# Patient Record
Sex: Female | Born: 1945 | Race: Black or African American | Hispanic: No | Smoking: Never smoker
Health system: Southern US, Community
[De-identification: ages and names within clinical notes are randomized; demographics above are authoritative.]

## PROBLEM LIST (undated history)

## (undated) DIAGNOSIS — K219 Gastro-esophageal reflux disease without esophagitis: Secondary | ICD-10-CM

## (undated) DIAGNOSIS — Z5189 Encounter for other specified aftercare: Secondary | ICD-10-CM

## (undated) DIAGNOSIS — E785 Hyperlipidemia, unspecified: Secondary | ICD-10-CM

## (undated) DIAGNOSIS — E119 Type 2 diabetes mellitus without complications: Secondary | ICD-10-CM

## (undated) DIAGNOSIS — D649 Anemia, unspecified: Secondary | ICD-10-CM

## (undated) DIAGNOSIS — F419 Anxiety disorder, unspecified: Secondary | ICD-10-CM

## (undated) DIAGNOSIS — T7840XA Allergy, unspecified, initial encounter: Secondary | ICD-10-CM

## (undated) DIAGNOSIS — I10 Essential (primary) hypertension: Secondary | ICD-10-CM

## (undated) DIAGNOSIS — H269 Unspecified cataract: Secondary | ICD-10-CM

## (undated) DIAGNOSIS — M1990 Unspecified osteoarthritis, unspecified site: Secondary | ICD-10-CM

## (undated) HISTORY — DX: Gastro-esophageal reflux disease without esophagitis: K21.9

## (undated) HISTORY — DX: Allergy, unspecified, initial encounter: T78.40XA

## (undated) HISTORY — DX: Unspecified osteoarthritis, unspecified site: M19.90

## (undated) HISTORY — DX: Unspecified cataract: H26.9

## (undated) HISTORY — DX: Encounter for other specified aftercare: Z51.89

## (undated) HISTORY — DX: Hyperlipidemia, unspecified: E78.5

## (undated) HISTORY — PX: SPINE SURGERY: SHX786

## (undated) HISTORY — PX: POLYPECTOMY: SHX149

## (undated) HISTORY — PX: KNEE SURGERY: SHX244

## (undated) HISTORY — PX: COLONOSCOPY: SHX174

## (undated) HISTORY — DX: Type 2 diabetes mellitus without complications: E11.9

## (undated) HISTORY — DX: Essential (primary) hypertension: I10

## (undated) HISTORY — DX: Anemia, unspecified: D64.9

## (undated) HISTORY — PX: CATARACT EXTRACTION, BILATERAL: SHX1313

## (undated) HISTORY — DX: Anxiety disorder, unspecified: F41.9

## (undated) HISTORY — PX: BUNIONECTOMY: SHX129

---

## 1999-06-17 ENCOUNTER — Encounter: Admission: RE | Admit: 1999-06-17 | Discharge: 1999-06-17 | Payer: Self-pay | Admitting: *Deleted

## 2000-07-31 ENCOUNTER — Emergency Department (HOSPITAL_COMMUNITY): Admission: EM | Admit: 2000-07-31 | Discharge: 2000-07-31 | Payer: Self-pay | Admitting: Emergency Medicine

## 2006-03-09 ENCOUNTER — Encounter: Payer: Self-pay | Admitting: Internal Medicine

## 2006-03-09 LAB — CONVERTED CEMR LAB

## 2006-08-03 ENCOUNTER — Ambulatory Visit: Payer: Self-pay | Admitting: Oncology

## 2006-10-10 ENCOUNTER — Ambulatory Visit: Payer: Self-pay | Admitting: Internal Medicine

## 2006-10-10 LAB — CONVERTED CEMR LAB
ALT: 11 units/L (ref 0–35)
AST: 15 units/L (ref 0–37)
Albumin: 4.1 g/dL (ref 3.5–5.2)
Alkaline Phosphatase: 63 units/L (ref 39–117)
BUN: 11 mg/dL (ref 6–23)
Basophils Absolute: 0 10*3/uL (ref 0.0–0.1)
Basophils Relative: 1 % (ref 0.0–1.0)
Bilirubin, Direct: 0.2 mg/dL (ref 0.0–0.3)
CO2: 28 meq/L (ref 19–32)
Calcium: 9.1 mg/dL (ref 8.4–10.5)
Chloride: 101 meq/L (ref 96–112)
Creatinine, Ser: 0.8 mg/dL (ref 0.4–1.2)
Creatinine,U: 164 mg/dL
Eosinophils Absolute: 0 10*3/uL (ref 0.0–0.6)
Eosinophils Relative: 0.7 % (ref 0.0–5.0)
GFR calc Af Amer: 95 mL/min
GFR calc non Af Amer: 78 mL/min
Glucose, Bld: 84 mg/dL (ref 70–99)
HCT: 37.7 % (ref 36.0–46.0)
Hemoglobin: 12.5 g/dL (ref 12.0–15.0)
Hgb A1c MFr Bld: 5.1 % (ref 4.6–6.0)
Lymphocytes Relative: 45 % (ref 12.0–46.0)
MCHC: 33.1 g/dL (ref 30.0–36.0)
MCV: 67.7 fL — ABNORMAL LOW (ref 78.0–100.0)
Microalb Creat Ratio: 2.4 mg/g (ref 0.0–30.0)
Microalb, Ur: 0.4 mg/dL (ref 0.0–1.9)
Monocytes Absolute: 0.2 10*3/uL (ref 0.2–0.7)
Monocytes Relative: 7.2 % (ref 3.0–11.0)
Neutro Abs: 1.5 10*3/uL (ref 1.4–7.7)
Neutrophils Relative %: 46.1 % (ref 43.0–77.0)
Platelets: 121 10*3/uL — ABNORMAL LOW (ref 150–400)
Potassium: 3.5 meq/L (ref 3.5–5.1)
RBC: 5.57 M/uL — ABNORMAL HIGH (ref 3.87–5.11)
RDW: 15.5 % — ABNORMAL HIGH (ref 11.5–14.6)
Sodium: 138 meq/L (ref 135–145)
TSH: 0.85 microintl units/mL (ref 0.35–5.50)
Total Bilirubin: 0.9 mg/dL (ref 0.3–1.2)
Total Protein: 6.7 g/dL (ref 6.0–8.3)
WBC: 3.3 10*3/uL — ABNORMAL LOW (ref 4.5–10.5)

## 2006-10-29 ENCOUNTER — Ambulatory Visit: Payer: Self-pay | Admitting: Family Medicine

## 2006-11-24 ENCOUNTER — Encounter: Payer: Self-pay | Admitting: Internal Medicine

## 2006-11-24 DIAGNOSIS — R519 Headache, unspecified: Secondary | ICD-10-CM | POA: Insufficient documentation

## 2006-11-24 DIAGNOSIS — E119 Type 2 diabetes mellitus without complications: Secondary | ICD-10-CM | POA: Insufficient documentation

## 2006-11-24 DIAGNOSIS — M1990 Unspecified osteoarthritis, unspecified site: Secondary | ICD-10-CM | POA: Insufficient documentation

## 2006-11-24 DIAGNOSIS — K219 Gastro-esophageal reflux disease without esophagitis: Secondary | ICD-10-CM | POA: Insufficient documentation

## 2006-11-24 DIAGNOSIS — F329 Major depressive disorder, single episode, unspecified: Secondary | ICD-10-CM

## 2006-11-24 DIAGNOSIS — R51 Headache: Secondary | ICD-10-CM | POA: Insufficient documentation

## 2006-11-24 DIAGNOSIS — Z860100 Personal history of colon polyps, unspecified: Secondary | ICD-10-CM | POA: Insufficient documentation

## 2006-11-24 DIAGNOSIS — F418 Other specified anxiety disorders: Secondary | ICD-10-CM | POA: Insufficient documentation

## 2006-11-24 DIAGNOSIS — I10 Essential (primary) hypertension: Secondary | ICD-10-CM

## 2006-11-29 ENCOUNTER — Ambulatory Visit: Payer: Self-pay | Admitting: Internal Medicine

## 2007-02-06 ENCOUNTER — Ambulatory Visit: Payer: Self-pay | Admitting: Internal Medicine

## 2007-02-06 DIAGNOSIS — M21619 Bunion of unspecified foot: Secondary | ICD-10-CM

## 2007-02-06 DIAGNOSIS — M76899 Other specified enthesopathies of unspecified lower limb, excluding foot: Secondary | ICD-10-CM

## 2007-02-06 DIAGNOSIS — G4700 Insomnia, unspecified: Secondary | ICD-10-CM

## 2007-02-06 DIAGNOSIS — D72819 Decreased white blood cell count, unspecified: Secondary | ICD-10-CM | POA: Insufficient documentation

## 2007-02-06 DIAGNOSIS — D696 Thrombocytopenia, unspecified: Secondary | ICD-10-CM

## 2007-02-06 DIAGNOSIS — F411 Generalized anxiety disorder: Secondary | ICD-10-CM | POA: Insufficient documentation

## 2007-04-18 ENCOUNTER — Ambulatory Visit: Payer: Self-pay | Admitting: Internal Medicine

## 2007-04-18 DIAGNOSIS — G5600 Carpal tunnel syndrome, unspecified upper limb: Secondary | ICD-10-CM | POA: Insufficient documentation

## 2007-04-18 DIAGNOSIS — N390 Urinary tract infection, site not specified: Secondary | ICD-10-CM

## 2007-04-18 DIAGNOSIS — M25519 Pain in unspecified shoulder: Secondary | ICD-10-CM

## 2007-04-19 LAB — CONVERTED CEMR LAB
BUN: 13 mg/dL (ref 6–23)
Bacteria, UA: NEGATIVE
Bilirubin Urine: NEGATIVE
CO2: 34 meq/L — ABNORMAL HIGH (ref 19–32)
Calcium: 9.7 mg/dL (ref 8.4–10.5)
Chloride: 100 meq/L (ref 96–112)
Cholesterol: 175 mg/dL (ref 0–200)
Creatinine, Ser: 0.9 mg/dL (ref 0.4–1.2)
Crystals: NEGATIVE
GFR calc Af Amer: 82 mL/min
GFR calc non Af Amer: 68 mL/min
Glucose, Bld: 194 mg/dL — ABNORMAL HIGH (ref 70–99)
HDL: 83.7 mg/dL (ref >39.0)
Hgb A1c MFr Bld: 7.2 % — ABNORMAL HIGH (ref 4.6–6.0)
LDL Cholesterol: 77 mg/dL (ref 0–99)
Mucus, UA: NEGATIVE
Nitrite: NEGATIVE
Potassium: 4.3 meq/L (ref 3.5–5.1)
Sodium: 141 meq/L (ref 135–145)
Specific Gravity, Urine: 1.025 (ref 1.000–1.03)
Total CHOL/HDL Ratio: 2.1
Total Protein, Urine: NEGATIVE mg/dL
Triglycerides: 71 mg/dL (ref 0–149)
Urine Glucose: 100 mg/dL — AB
Urobilinogen, UA: 0.2 (ref 0.0–1.0)
VLDL: 14 mg/dL (ref 0–40)
pH: 6 (ref 5.0–8.0)

## 2007-04-21 ENCOUNTER — Encounter: Payer: Self-pay | Admitting: Internal Medicine

## 2007-05-12 ENCOUNTER — Ambulatory Visit: Payer: Self-pay | Admitting: Internal Medicine

## 2007-05-15 ENCOUNTER — Encounter: Payer: Self-pay | Admitting: Internal Medicine

## 2007-05-17 ENCOUNTER — Encounter: Payer: Self-pay | Admitting: Internal Medicine

## 2007-05-25 ENCOUNTER — Encounter: Payer: Self-pay | Admitting: Internal Medicine

## 2007-06-21 ENCOUNTER — Encounter: Payer: Self-pay | Admitting: Internal Medicine

## 2007-08-07 ENCOUNTER — Ambulatory Visit: Payer: Self-pay | Admitting: Internal Medicine

## 2007-08-07 LAB — CONVERTED CEMR LAB
BUN: 15 mg/dL (ref 6–23)
Blood Glucose, Fingerstick: 128
CO2: 32 meq/L (ref 19–32)
Calcium: 9.3 mg/dL (ref 8.4–10.5)
Chloride: 99 meq/L (ref 96–112)
Cholesterol: 194 mg/dL (ref 0–200)
Creatinine, Ser: 0.9 mg/dL (ref 0.4–1.2)
GFR calc Af Amer: 82 mL/min
GFR calc non Af Amer: 68 mL/min
Glucose, Bld: 122 mg/dL — ABNORMAL HIGH (ref 70–99)
HDL: 96.6 mg/dL (ref >39.0)
Hgb A1c MFr Bld: 6.3 % — ABNORMAL HIGH (ref 4.6–6.0)
LDL Cholesterol: 88 mg/dL (ref 0–99)
Potassium: 4.1 meq/L (ref 3.5–5.1)
Sodium: 139 meq/L (ref 135–145)
Total CHOL/HDL Ratio: 2
Triglycerides: 45 mg/dL (ref 0–149)
VLDL: 9 mg/dL (ref 0–40)

## 2007-11-27 ENCOUNTER — Encounter: Payer: Self-pay | Admitting: Internal Medicine

## 2011-01-13 DIAGNOSIS — N952 Postmenopausal atrophic vaginitis: Secondary | ICD-10-CM | POA: Insufficient documentation

## 2011-01-13 DIAGNOSIS — E119 Type 2 diabetes mellitus without complications: Secondary | ICD-10-CM | POA: Insufficient documentation

## 2012-11-17 DIAGNOSIS — M1710 Unilateral primary osteoarthritis, unspecified knee: Secondary | ICD-10-CM | POA: Insufficient documentation

## 2012-11-17 DIAGNOSIS — M23309 Other meniscus derangements, unspecified meniscus, unspecified knee: Secondary | ICD-10-CM | POA: Insufficient documentation

## 2014-01-23 DIAGNOSIS — G8929 Other chronic pain: Secondary | ICD-10-CM | POA: Insufficient documentation

## 2014-01-23 DIAGNOSIS — M542 Cervicalgia: Secondary | ICD-10-CM | POA: Insufficient documentation

## 2014-03-05 DIAGNOSIS — M5432 Sciatica, left side: Secondary | ICD-10-CM | POA: Insufficient documentation

## 2014-03-05 DIAGNOSIS — M5431 Sciatica, right side: Secondary | ICD-10-CM | POA: Insufficient documentation

## 2014-03-05 DIAGNOSIS — M7062 Trochanteric bursitis, left hip: Secondary | ICD-10-CM | POA: Insufficient documentation

## 2014-03-05 DIAGNOSIS — M7061 Trochanteric bursitis, right hip: Secondary | ICD-10-CM | POA: Insufficient documentation

## 2014-06-13 DIAGNOSIS — M7541 Impingement syndrome of right shoulder: Secondary | ICD-10-CM | POA: Insufficient documentation

## 2014-06-20 DIAGNOSIS — M19019 Primary osteoarthritis, unspecified shoulder: Secondary | ICD-10-CM | POA: Insufficient documentation

## 2015-05-20 DIAGNOSIS — L299 Pruritus, unspecified: Secondary | ICD-10-CM | POA: Insufficient documentation

## 2015-05-20 DIAGNOSIS — J301 Allergic rhinitis due to pollen: Secondary | ICD-10-CM | POA: Insufficient documentation

## 2015-08-20 DIAGNOSIS — Z789 Other specified health status: Secondary | ICD-10-CM | POA: Insufficient documentation

## 2015-11-27 DIAGNOSIS — M5440 Lumbago with sciatica, unspecified side: Secondary | ICD-10-CM | POA: Insufficient documentation

## 2015-11-27 DIAGNOSIS — M4326 Fusion of spine, lumbar region: Secondary | ICD-10-CM | POA: Insufficient documentation

## 2015-11-27 DIAGNOSIS — G8929 Other chronic pain: Secondary | ICD-10-CM | POA: Insufficient documentation

## 2016-10-15 DIAGNOSIS — R0989 Other specified symptoms and signs involving the circulatory and respiratory systems: Secondary | ICD-10-CM | POA: Insufficient documentation

## 2017-08-08 DIAGNOSIS — E119 Type 2 diabetes mellitus without complications: Secondary | ICD-10-CM | POA: Insufficient documentation

## 2017-09-26 DIAGNOSIS — F3342 Major depressive disorder, recurrent, in full remission: Secondary | ICD-10-CM | POA: Insufficient documentation

## 2018-04-07 ENCOUNTER — Ambulatory Visit: Payer: Self-pay | Admitting: Nurse Practitioner

## 2018-07-03 ENCOUNTER — Ambulatory Visit: Payer: Self-pay | Admitting: Nurse Practitioner

## 2018-07-14 ENCOUNTER — Ambulatory Visit (INDEPENDENT_AMBULATORY_CARE_PROVIDER_SITE_OTHER): Payer: Medicare Other | Admitting: Internal Medicine

## 2018-07-14 ENCOUNTER — Other Ambulatory Visit: Payer: Self-pay

## 2018-07-14 ENCOUNTER — Encounter: Payer: Self-pay | Admitting: Internal Medicine

## 2018-07-14 VITALS — BP 142/80 | HR 76 | Temp 97.7°F | Ht 63.0 in | Wt 171.4 lb

## 2018-07-14 DIAGNOSIS — E1165 Type 2 diabetes mellitus with hyperglycemia: Secondary | ICD-10-CM | POA: Diagnosis not present

## 2018-07-14 LAB — BASIC METABOLIC PANEL
BUN: 17 mg/dL (ref 6–23)
CO2: 29 mEq/L (ref 19–32)
Calcium: 9.7 mg/dL (ref 8.4–10.5)
Chloride: 100 mEq/L (ref 96–112)
Creatinine, Ser: 0.87 mg/dL (ref 0.40–1.20)
GFR: 77.76 mL/min (ref >60.00)
Glucose, Bld: 235 mg/dL — ABNORMAL HIGH (ref 70–99)
Potassium: 4 mEq/L (ref 3.5–5.1)
Sodium: 138 mEq/L (ref 135–145)

## 2018-07-14 LAB — MICROALBUMIN / CREATININE URINE RATIO
Creatinine,U: 57.2 mg/dL
Microalb Creat Ratio: 1.2 mg/g (ref 0.0–30.0)
Microalb, Ur: <0.7 mg/dL (ref 0.0–1.9)

## 2018-07-14 LAB — LIPID PANEL
Cholesterol: 155 mg/dL (ref 0–200)
HDL: 73.4 mg/dL (ref >39.00)
LDL Cholesterol: 68 mg/dL (ref 0–99)
NonHDL: 81.53
Total CHOL/HDL Ratio: 2
Triglycerides: 69 mg/dL (ref 0.0–149.0)
VLDL: 13.8 mg/dL (ref 0.0–40.0)

## 2018-07-14 LAB — POCT GLYCOSYLATED HEMOGLOBIN (HGB A1C): Hemoglobin A1C: 7.8 % — AB (ref 4.0–5.6)

## 2018-07-14 NOTE — Progress Notes (Signed)
Name: Ashley Merritt  MRN/ DOB: [id number], 02/08/1946   Age/ Sex: 73 y.o., female    PCP: Ashley Merritt, Ashley Merritt   Reason for Endocrinology Evaluation: Type 2 Diabetes Mellitus     Date of Initial Endocrinology Visit: 07/17/2018     PATIENT IDENTIFIER: Ashley Merritt is a 73 y.o. female with a past medical history of T2DM, Dyslipidemia and HTN. The patient presented for initial endocrinology clinic visit on 07/17/2018 for consultative assistance with her diabetes management.    HPI: Ashley Merritt is accompanied by her son today    Diagnosed with T2DM in 1990 , found on routine work up  Prior Medications tried/Intolerance: She was initially started on insulin but has been off for many years. She has been on Metformin with GI side effects, SU, and Januvia. She went back to insulin briefly in 2014 due to hyperglycemia secondary to a back intra-articular injection.  Currently checking blood sugars 2 x / day,  before breakfast and supper  Hypoglycemia episodes : no         Hemoglobin A1c has ranged from 7.1% in 2019, peaking at 8.1% in 2018. Patient required assistance for hypoglycemia: no Patient has required hospitalization within the last 1 year from hyper or hypoglycemia: no  In terms of diet, the patient avoids sugar-sweetened beverages    HOME DIABETES REGIMEN: Metformin 500 mg XR BID Glipizide 2.5 mg BID  Januvia 100 mg once daily   Statin: yes ACE-I/ARB: yes Prior Diabetic Education: yes   GLUCOSE LOG:  Date Breakfast  Supper  07/07/2018 147   5/9  200  5/10 151   5/11 191 147  5/12  127  5/13 189   5/14 204 174    DIABETIC COMPLICATIONS: Microvascular complications:   ? Retinopathy in 03/2017 per endo note but pt unaware  Denies: neuropathy, CKD  Last eye exam: Completed 03/2017  Macrovascular complications:    Denies: CAD, PVD, CVA   PAST HISTORY:  Past Medical History: T2DM, HTN, GERD, dyslipidemia   Past Surgical History:    Social  History:  reports that she has never smoked. She has never used smokeless tobacco. No history on file for alcohol and drug.  Family History: No family history on file.   HOME MEDICATIONS: Allergies as of 07/14/2018      Reactions   Metformin    REACTION: gi upset even 1 x 500 mg   Sertraline Hcl    REACTION: "couldn"t think"      Medication List       Accurate as of Jul 14, 2018 11:59 PM. If you have any questions, ask your nurse or doctor.        Accu-Chek FastClix Lancets Misc 2 (two) times daily. for testing   atorvastatin 10 MG tablet Commonly known as:  LIPITOR Take 10 mg by mouth daily.   azelastine 0.1 % nasal spray Commonly known as:  ASTELIN PLACE 1 2 SPRAYS INTO BOTH NOSTRILS 2 (TWO) TIMES DAILY   glipiZIDE 5 MG tablet Commonly known as:  GLUCOTROL Take 5 mg by mouth 2 (two) times daily with a meal.   Januvia 100 MG tablet Generic drug:  sitaGLIPtin Take 1 tablet (100 mg total) by mouth daily.   levocetirizine 5 MG tablet Commonly known as:  XYZAL every evening.   losartan 100 MG tablet Commonly known as:  COZAAR Take 100 mg by mouth daily.   metFORMIN 500 MG 24 hr tablet Commonly known as:  GLUCOPHAGE-XR  Polyethyl Glycol-Propyl Glycol 0.4-0.3 % Soln Apply to eye.        ALLERGIES: Allergies  Allergen Reactions  . Metformin     REACTION: gi upset even 1 x 500 mg  . Sertraline Hcl     REACTION: "couldn"t think"     REVIEW OF SYSTEMS: A comprehensive ROS was conducted with the patient and is negative except as per HPI and below:  Review of Systems  HENT: Positive for congestion. Negative for sore throat.   Eyes: Positive for blurred vision. Negative for pain.  Respiratory: Negative for cough and shortness of breath.   Cardiovascular: Positive for palpitations. Negative for chest pain.  Gastrointestinal: Negative for diarrhea and nausea.  Genitourinary: Positive for frequency.  Neurological: Negative for tingling and tremors.   Endo/Heme/Allergies: Negative for polydipsia.  Psychiatric/Behavioral: Positive for depression. The patient is nervous/anxious.       OBJECTIVE:   VITAL SIGNS: BP (!) 142/80 (BP Location: Left Arm, Patient Position: Sitting, Cuff Size: Normal)   Pulse 76   Temp 97.7 F (36.5 C)   Ht 5\' 3"  (1.6 m)   Wt 171 lb 6.4 oz (77.7 kg)   SpO2 98%   BMI 30.36 kg/m    PHYSICAL EXAM:  General: Pt appears well and is in NAD  Hydration: Well-hydrated with moist mucous membranes and good skin turgor  HEENT: Head: Unremarkable with good dentition. Oropharynx clear without exudate.  Eyes: External eye exam normal without stare, lid lag or exophthalmos.  EOM intact.  PERRL.  Neck: General: Supple without adenopathy or carotid bruits. Thyroid: Thyroid size normal.  No goiter or nodules appreciated. No thyroid bruit.  Lungs: Clear with good BS bilat with no rales, rhonchi, or wheezes  Heart: RRR with normal S1 and S2 and no gallops; no murmurs; no rub  Abdomen: Normoactive bowel sounds, soft, nontender, without masses or organomegaly palpable  Extremities:  Lower extremities - No pretibial edema. No lesions.  Skin: Normal texture and temperature to palpation. No rash noted.  Neuro: MS is good with appropriate affect, pt is alert and Ox3    DM foot exam: 07/14/2018 The skin of the feet is intact without sores or ulcerations. The pedal pulses are 2+ on right and 2+ on left. The sensation is intact to a screening 5.07, 10 gram monofilament bilaterally   DATA REVIEWED: 09/28/2017   A1c 7.1% Cr. 0.9   05/26/2016 8.1 %  07/14/2018   7.8%     Results for Ashley Merritt (MRN [id number]) as of 07/17/2018 11:23  Ref. Range 07/14/2018 10:20  Sodium Latest Ref Range: 135 - 145 mEq/L 138  Potassium Latest Ref Range: 3.5 - 5.1 mEq/L 4.0  Chloride Latest Ref Range: 96 - 112 mEq/L 100  CO2 Latest Ref Range: 19 - 32 mEq/L 29  Glucose Latest Ref Range: 70 - 99 mg/dL 235 (H)  BUN Latest Ref Range: 6 - 23  mg/dL 17  Creatinine Latest Ref Range: 0.40 - 1.20 mg/dL 0.87  Calcium Latest Ref Range: 8.4 - 10.5 mg/dL 9.7  GFR Latest Ref Range: >60.00 mL/min 77.76  Total CHOL/HDL Ratio Unknown 2  Cholesterol Latest Ref Range: 0 - 200 mg/dL 155  HDL Cholesterol Latest Ref Range: >39.00 mg/dL 73.40  LDL (calc) Latest Ref Range: 0 - 99 mg/dL 68  MICROALB/CREAT RATIO Latest Ref Range: 0.0 - 30.0 mg/g 1.2  NonHDL Unknown 81.53  Triglycerides Latest Ref Range: 0.0 - 149.0 mg/dL 69.0  VLDL Latest Ref Range: 0.0 - 40.0 mg/dL 13.8  ASSESSMENT / PLAN / RECOMMENDATIONS:   1) Type 2 Diabetes Mellitus, Sub-Optimally controlled, Without complications - Most recent A1c of 7.8 %. Goal A1c < 7.0 %.  Up from 7.1 %  Plan: GENERAL:  Discussed the importance of avoiding sugar sweetened beverages as well as avoiding snacks  Pt has been taking 2 tablets of Januvia due to medication confusion. I have advised her to use a "pill-box " , discussed the risk of pancreatitis with [place] especially when she is taking more then the recommended dose.   Will adjust medications as below   Will obtain labs today, if GFR normal, will consider increasing Metformin dose in the future.   MEDICATIONS:  Increase Glipizide 5 mg BID with meals  Continue Metformin 500 mg XR 1 tablet BID  Januvia 100 mg daily   EDUCATION / INSTRUCTIONS:  BG monitoring instructions: Patient is instructed to check her blood sugars 2 times a day, fasting and bedtime.  Call [facility] if: BG persistently < 70 or > 300. . I reviewed the Rule of 15 for the treatment of hypoglycemia in detail with the patient. Literature supplied.   2) Diabetic complications:   Eye: Does not have known diabetic retinopathy (per patient)   Neuro/ Feet: Does not have known diabetic peripheral neuropathy.  Renal: Patient does not have known baseline CKD. She is on an ACEI/ARB at present.Check urine albumin/creatinine ratio yearly starting at  time of diagnosis. If albuminuria is positive, treatment is geared toward better glucose, blood pressure control and use of ACE inhibitors or ARBs. Monitor electrolytes and creatinine once to twice yearly.   3) Lipids: Patient is  on a statin. LDL at goal.    4) Hypertension: Above goal of < 140/90 mmHg. Will defer to PCP .    F/u in 3 months   Addendum: Lab results discussed with pt on 5/18 @ 11:30 am    Signed electronically by: Mack Guise, MD  [facility]  [facility] [address] [place], [place] [address] Phone: [phone] FAX: [phone]   CC: Ashley Merritt, [place] [address] Phone: [phone]  Fax: [phone]    Return to Endocrinology clinic as below: Future Appointments  Date Time Provider [place]  08/11/2018  9:20 AM Ashley Salvage, FNP [facility]  10/13/2018  9:50 AM Roddie Riegler, Melanie Crazier, MD [facility] None

## 2018-07-14 NOTE — Patient Instructions (Signed)
-   Continue Metformin 500 mg XR Twice a day with meals - Increase Glipizide to 5 mg (1 tablet) with Breakfast and Supper - Continue Januvia 100 mg daily     HOW TO TREAT LOW BLOOD SUGARS (Blood sugar LESS THAN 70 MG/DL)  Please follow the RULE OF 15 for the treatment of hypoglycemia treatment (when your (blood sugars are less than 70 mg/dL)    STEP 1: Take 15 grams of carbohydrates when your blood sugar is low, which includes:   3-4 GLUCOSE TABS  OR  3-4 OZ OF JUICE OR REGULAR SODA OR  ONE TUBE OF GLUCOSE GEL     STEP 2: RECHECK blood sugar in 15 MINUTES STEP 3: If your blood sugar is still low at the 15 minute recheck --> then, go back to STEP 1 and treat AGAIN with another 15 grams of carbohydrates.

## 2018-07-17 ENCOUNTER — Encounter: Payer: Self-pay | Admitting: Internal Medicine

## 2018-07-17 ENCOUNTER — Telehealth: Payer: Self-pay | Admitting: Internal Medicine

## 2018-07-17 MED ORDER — JANUVIA 100 MG PO TABS: 100.0000 mg | ORAL_TABLET | Freq: Every day | ORAL | 3 refills | Status: DC

## 2018-07-17 NOTE — Telephone Encounter (Signed)
This has been refilled today by Dr. Maretta Bees. Pt to notify them of update

## 2018-07-17 NOTE — Telephone Encounter (Signed)
MEDICATION: Januvia   PHARMACY:  [facility] [address] :  no  IS PATIENT OUT OF MEDICATION: yes  IF NOT; HOW MUCH IS LEFT:   LAST APPOINTMENT DATE: @5 /15/2020  NEXT APPOINTMENT DATE:@8 /14/2020  DO WE HAVE YOUR PERMISSION TO LEAVE A DETAILED MESSAGE: yes [phone]  OTHER COMMENTS:    **Let patient know to contact pharmacy at the end of the day to make sure medication is ready. **  ** Please notify patient to allow 48-72 hours to process**  **Encourage patient to contact the pharmacy for refills or they can request refills through [facility]**

## 2018-07-18 ENCOUNTER — Other Ambulatory Visit: Payer: Self-pay

## 2018-07-18 MED ORDER — GLIPIZIDE 5 MG PO TABS: 5.0000 mg | ORAL_TABLET | Freq: Two times a day (BID) | ORAL | 3 refills | Status: DC

## 2018-08-02 ENCOUNTER — Telehealth: Payer: Self-pay

## 2018-08-02 NOTE — Telephone Encounter (Signed)
Copied from [place] [id number]. Topic: Appointment Scheduling - Scheduling Inquiry for Clinic >> Aug 02, 2018  9:49 AM Erick Blinks wrote: Reason for CRM: Pt is requesting TB test before next appt. Please advise  [phone]  Routing to dr john---this patient has seen dr Jenny Reichmann in the past-(2011-2019)--then patient moved to [place] area---however, she has relocated back to [place] and is requesting tb test---but patient will need to get re-established with our pcp before we can do that----routing to dr Jenny Reichmann, since you have seen patient in the past, do you want me to reassign patient to you---and have her come in for office visit with you for tb test?  She currently has appt with laura murray for 08/11/18, but might be more appropriate to have her re-establish with dr Alleen Borne advise, I will call patient back

## 2018-08-02 NOTE — Telephone Encounter (Signed)
I have talked with patient---she is perfectly ok with re-establishing with dr Jenny Reichmann, she just didn't realize she could come back with same doctor---patient's appt with laura on 6/12 has been cancelled and I have rescheduled with dr Jenny Reichmann on 6/12 at 11am--pt also needs to have tb skin test placed if possible at same visit, this will be up to dr john---if ok with him and skin test is placed, patient advised she MUST come back on Monday between 8 and 11am to have it read--pt repeated back for understanding

## 2018-08-02 NOTE — Telephone Encounter (Signed)
[place] for re-establish with me, thanks

## 2018-08-11 ENCOUNTER — Encounter: Payer: Self-pay | Admitting: Internal Medicine

## 2018-08-11 ENCOUNTER — Ambulatory Visit: Payer: Self-pay | Admitting: Family

## 2018-08-11 ENCOUNTER — Ambulatory Visit (INDEPENDENT_AMBULATORY_CARE_PROVIDER_SITE_OTHER): Payer: Medicare Other | Admitting: Internal Medicine

## 2018-08-11 ENCOUNTER — Other Ambulatory Visit: Payer: Self-pay

## 2018-08-11 ENCOUNTER — Other Ambulatory Visit (INDEPENDENT_AMBULATORY_CARE_PROVIDER_SITE_OTHER): Payer: Medicare Other

## 2018-08-11 VITALS — BP 138/90 | HR 89 | Temp 97.9°F | Ht 63.0 in | Wt 167.0 lb

## 2018-08-11 DIAGNOSIS — Z1159 Encounter for screening for other viral diseases: Secondary | ICD-10-CM | POA: Diagnosis not present

## 2018-08-11 DIAGNOSIS — Z021 Encounter for pre-employment examination: Secondary | ICD-10-CM

## 2018-08-11 DIAGNOSIS — E559 Vitamin D deficiency, unspecified: Secondary | ICD-10-CM

## 2018-08-11 DIAGNOSIS — E538 Deficiency of other specified B group vitamins: Secondary | ICD-10-CM | POA: Diagnosis not present

## 2018-08-11 DIAGNOSIS — G4700 Insomnia, unspecified: Secondary | ICD-10-CM

## 2018-08-11 DIAGNOSIS — R6889 Other general symptoms and signs: Secondary | ICD-10-CM

## 2018-08-11 DIAGNOSIS — Z0000 Encounter for general adult medical examination without abnormal findings: Secondary | ICD-10-CM

## 2018-08-11 DIAGNOSIS — E611 Iron deficiency: Secondary | ICD-10-CM

## 2018-08-11 DIAGNOSIS — F418 Other specified anxiety disorders: Secondary | ICD-10-CM

## 2018-08-11 DIAGNOSIS — Z23 Encounter for immunization: Secondary | ICD-10-CM

## 2018-08-11 DIAGNOSIS — L609 Nail disorder, unspecified: Secondary | ICD-10-CM

## 2018-08-11 DIAGNOSIS — I10 Essential (primary) hypertension: Secondary | ICD-10-CM

## 2018-08-11 LAB — IBC PANEL
Iron: 76 ug/dL (ref 42–145)
Saturation Ratios: 26 % (ref 20.0–50.0)
Transferrin: 209 mg/dL — ABNORMAL LOW (ref 212.0–360.0)

## 2018-08-11 LAB — VITAMIN D 25 HYDROXY (VIT D DEFICIENCY, FRACTURES): VITD: 54.52 ng/mL (ref 30.00–100.00)

## 2018-08-11 LAB — VITAMIN B12: Vitamin B-12: 552 pg/mL (ref 211–911)

## 2018-08-11 LAB — T4, FREE: Free T4: 1.01 ng/dL (ref 0.60–1.60)

## 2018-08-11 LAB — TSH: TSH: 1.09 u[IU]/mL (ref 0.35–4.50)

## 2018-08-11 MED ORDER — MIRTAZAPINE 7.5 MG PO TABS: 7.5000 mg | ORAL_TABLET | Freq: Every day | ORAL | 3 refills | Status: DC

## 2018-08-11 NOTE — Assessment & Plan Note (Signed)
[place] for remerom 7.5 mg qhs prn

## 2018-08-11 NOTE — Patient Instructions (Addendum)
You had the Prevnar 13 pneumonia shot today  Your TB skin test was placed today, and please return on Monday to have this checked  Please take all new medication as prescribed - the remeron at bedtime for sleep and depression  You will be contacted regarding the referral for: podiatry (foot doctor)  Please continue all other medications as before, and refills have been done if requested.  Please have the pharmacy call with any other refills you may need.  Please continue your efforts at being more active, low cholesterol diet, and weight control.  You are otherwise up to date with prevention measures today.  Please keep your appointments with your specialists as you may have planned  Please go to the LAB in the Basement (turn left off the elevator) for the tests to be done today  You will be contacted by phone if any changes need to be made immediately.  Otherwise, you will receive a letter about your results with an explanation, but please check with MyChart first.  Please remember to sign up for MyChart if you have not done so, as this will be important to you in the future with finding out test results, communicating by private email, and scheduling acute appointments online when needed.  Please return in 6 months, or sooner if needed

## 2018-08-11 NOTE — Addendum Note (Signed)
Addended by: Biagio Borg on: 08/11/2018 12:19 PM   Modules accepted: Level of Service

## 2018-08-11 NOTE — Progress Notes (Signed)
Subjective:    Patient ID: Ashley Merritt, female    DOB: 1945/07/06, 73 y.o.   MRN: [id number]  HPI  Here for yearly exam ot re-establish PCP;  Overall doing ok;  Pt denies Chest pain, worsening SOB, DOE, wheezing, orthopnea, PND, worsening LE edema, palpitations, dizziness or syncope.  Pt denies neurological change such as new headdache, facial or extremity weakness.  Pt denies polydipsia, polyuria, or low sugar symptoms. Pt states overall good compliance with treatment and medications, good tolerability, and has been trying to follow appropriate diet.  Pt has had mild worsening depressive symptoms, but no suicidal ideation or panic. Has remarkable difficutly for many months with anxiety and getting to sleep.   No fever, night sweats, wt loss, loss of appetite, or other constitutional symptoms.  Pt states good ability with ADL's, has low fall risk, home safety reviewed and adequate, no other significant changes in hearing or vision, and only occasionally active with exercise.   Wt Readings from Last 3 Encounters:  08/11/18 167 lb (75.8 kg)  07/14/18 171 lb 6.4 oz (77.7 kg)  10/10/06 146 lb (66.2 kg)  Does also have toenail issues, asks for podiatry referral.   Past Medical History:  Diagnosis Date  . Diabetes mellitus without complication ([place])   . Hyperlipidemia   . Hypertension    Past Surgical History:  Procedure Laterality Date  . SPINE SURGERY      reports that she has never smoked. She has never used smokeless tobacco. She reports that she does not drink alcohol or use drugs. family history includes Diabetes in her brother and sister. Allergies  Allergen Reactions  . Metformin     REACTION: gi upset even 1 x 500 mg  . Sertraline Hcl     REACTION: "couldn"t think"   Current Outpatient Medications on File Prior to Visit  Medication Sig Dispense Refill  . Accu-Chek FastClix Lancets MISC 2 (two) times daily. for testing    . atorvastatin (LIPITOR) 10 MG tablet Take 10 mg by  mouth daily.    Marland Kitchen azelastine (ASTELIN) 0.1 % nasal spray PLACE 1 2 SPRAYS INTO BOTH NOSTRILS 2 (TWO) TIMES DAILY    . glipiZIDE (GLUCOTROL) 5 MG tablet Take 1 tablet (5 mg total) by mouth 2 (two) times daily with a meal. 60 tablet 3  . JANUVIA 100 MG tablet Take 1 tablet (100 mg total) by mouth daily. 90 tablet 3  . levocetirizine (XYZAL) 5 MG tablet every evening.    Marland Kitchen losartan (COZAAR) 100 MG tablet Take 100 mg by mouth daily.    . metFORMIN (GLUCOPHAGE-XR) 500 MG 24 hr tablet     . Polyethyl Glycol-Propyl Glycol 0.4-0.3 % SOLN Apply to eye.     No current facility-administered medications on file prior to visit.    Review of Systems  Constitutional: Negative for other unusual diaphoresis or sweats HENT: Negative for ear discharge or swelling Eyes: Negative for other worsening visual disturbances Respiratory: Negative for stridor or other swelling  Gastrointestinal: Negative for worsening distension or other blood Genitourinary: Negative for retention or other urinary change Musculoskeletal: Negative for other MSK pain or swelling Skin: Negative for color change or other new lesions Neurological: Negative for worsening tremors and other numbness  Psychiatric/Behavioral: Negative for worsening agitation or other fatigue All other system neg per pt    Objective:   Physical Exam BP 138/90   Pulse 89   Temp 97.9 F (36.6 C) (Oral)   Ht 5\' 3"  (1.6  m)   Wt 167 lb (75.8 kg)   SpO2 97%   BMI 29.58 kg/m  VS noted,  Constitutional: Pt appears in NAD HENT: Head: NCAT.  Right Ear: External ear normal.  Left Ear: External ear normal.  Eyes: . Pupils are equal, round, and reactive to light. Conjunctivae and EOM are normal Nose: without d/c or deformity Neck: Neck supple. Gross normal ROM Cardiovascular: Normal rate and regular rhythm.   Pulmonary/Chest: Effort normal and breath sounds without rales or wheezing.  Abd:  Soft, NT, ND, + BS, no organomegaly Neurological: Pt is alert. At  baseline orientation, motor grossly intact Skin: Skin is warm. No rashes, other new lesions, no LE edema Psychiatric: Pt behavior is normal without agitation, + depressed nervous affect and mood  No other exam findings Lab Results  Component Value Date   WBC 3.3 (L) 10/10/2006   HGB 12.5 10/10/2006   HCT 37.7 10/10/2006   PLT 121 (L) 10/10/2006   GLUCOSE 235 (H) 07/14/2018   CHOL 155 07/14/2018   TRIG 69.0 07/14/2018   HDL 73.40 07/14/2018   LDLCALC 68 07/14/2018   ALT 11 10/10/2006   AST 15 10/10/2006   NA 138 07/14/2018   K 4.0 07/14/2018   CL 100 07/14/2018   CREATININE 0.87 07/14/2018   BUN 17 07/14/2018   CO2 29 07/14/2018   TSH 0.85 10/10/2006   HGBA1C 7.8 (A) 07/14/2018   MICROALBUR <0.7 07/14/2018      Assessment & Plan:

## 2018-08-11 NOTE — Assessment & Plan Note (Signed)
[place] for remeron 7.5 qhs, declines counseling referral

## 2018-08-11 NOTE — Assessment & Plan Note (Signed)
stable overall by history and exam, recent data reviewed with pt, and pt to continue medical treatment as before,  to f/u any worsening symptoms or concerns  

## 2018-08-14 LAB — HEPATITIS C ANTIBODY
Hepatitis C Ab: NONREACTIVE
SIGNAL TO CUT-OFF: 0.01 (ref <1.00)

## 2018-08-14 LAB — TB SKIN TEST
Induration: 0 mm
TB Skin Test: NEGATIVE

## 2018-08-17 ENCOUNTER — Other Ambulatory Visit: Payer: Self-pay | Admitting: Internal Medicine

## 2018-08-17 ENCOUNTER — Telehealth: Payer: Self-pay

## 2018-08-17 MED ORDER — TELMISARTAN 80 MG PO TABS: 80.0000 mg | ORAL_TABLET | Freq: Every day | ORAL | 3 refills | Status: DC

## 2018-08-17 MED ORDER — CANDESARTAN CILEXETIL 32 MG PO TABS: 32.0000 mg | ORAL_TABLET | Freq: Every day | ORAL | 3 refills | Status: DC

## 2018-08-17 NOTE — Telephone Encounter (Signed)
Ok to change to atacand 32 qd

## 2018-08-17 NOTE — Telephone Encounter (Signed)
Copied from [place] [id number]. Topic: General - Other >> Aug 17, 2018 11:52 AM Rainey Pines A wrote: Patient stated that pharmacy said medication losartan is on back order and patient needs an alternative medication prescribed and would like callback

## 2018-08-17 NOTE — Addendum Note (Signed)
Addended by: Biagio Borg on: 08/17/2018 01:28 PM   Modules accepted: Orders

## 2018-09-15 ENCOUNTER — Ambulatory Visit (INDEPENDENT_AMBULATORY_CARE_PROVIDER_SITE_OTHER): Payer: Medicare Other | Admitting: Podiatry

## 2018-09-15 ENCOUNTER — Ambulatory Visit (INDEPENDENT_AMBULATORY_CARE_PROVIDER_SITE_OTHER): Payer: Medicare Other

## 2018-09-15 ENCOUNTER — Other Ambulatory Visit: Payer: Self-pay

## 2018-09-15 ENCOUNTER — Other Ambulatory Visit: Payer: Self-pay | Admitting: Podiatry

## 2018-09-15 ENCOUNTER — Encounter: Payer: Self-pay | Admitting: Podiatry

## 2018-09-15 VITALS — Temp 96.7°F

## 2018-09-15 DIAGNOSIS — M779 Enthesopathy, unspecified: Secondary | ICD-10-CM | POA: Diagnosis not present

## 2018-09-15 DIAGNOSIS — M2012 Hallux valgus (acquired), left foot: Secondary | ICD-10-CM

## 2018-09-15 DIAGNOSIS — M2011 Hallux valgus (acquired), right foot: Secondary | ICD-10-CM

## 2018-09-21 NOTE — Progress Notes (Signed)
Subjective:   Patient ID: Ashley Merritt, female   DOB: 73 y.o.   MRN: [id number]   HPI Patient presents stating she has had a history of bunion correction right and she wants to get that checked and also started to get discomfort on the left and she wanted to check for bunion whether or not she will need surgery on that.  Patient [place] in [place] state and has moved here and has not had care recently.  Patient does not smoke likes to be active   Review of Systems  All other systems reviewed and are negative.       Objective:  Physical Exam Vitals signs and nursing note reviewed.  Constitutional:      Appearance: She is well-developed.  Pulmonary:     Effort: Pulmonary effort is normal.  Musculoskeletal: Normal range of motion.  Skin:    General: Skin is warm.  Neurological:     Mental Status: She is alert.     Neurovascular status intact muscle strength found to be adequate range of motion within normal limits.  Patient is found to have a scar of the right first metatarsal with bunion correction with moderate valgus deformity and does have moderate valgus deformity left with mild discomfort around the first metatarsal.  Patient is found to have good digital perfusion well oriented x3     Assessment:  2 separate problems with one being inflammatory condition around the first MPJ right and then also having structural bunion deformity moderate nature left     Plan:  H&P x-rays reviewed both conditions discussed.  At this point I have recommended wider shoes and padding therapy and I do not recommend any aggressive treatment but if symptoms were to get worse we will need to consider other treatments for her condition.  Patient will be seen back as needed and was advised on shoe gear modifications  X-rays indicate there is moderate bunion deformity left and a well-healed surgical site right first metatarsal from previous bunion surgery with fixation present

## 2018-10-11 ENCOUNTER — Other Ambulatory Visit: Payer: Self-pay | Admitting: Internal Medicine

## 2018-10-11 ENCOUNTER — Other Ambulatory Visit: Payer: Self-pay

## 2018-10-13 ENCOUNTER — Other Ambulatory Visit: Payer: Self-pay

## 2018-10-13 ENCOUNTER — Encounter: Payer: Self-pay | Admitting: Internal Medicine

## 2018-10-13 ENCOUNTER — Ambulatory Visit (INDEPENDENT_AMBULATORY_CARE_PROVIDER_SITE_OTHER): Payer: Medicare Other | Admitting: Internal Medicine

## 2018-10-13 VITALS — BP 122/80 | HR 71 | Temp 97.9°F | Ht 63.0 in | Wt 174.2 lb

## 2018-10-13 DIAGNOSIS — E1165 Type 2 diabetes mellitus with hyperglycemia: Secondary | ICD-10-CM

## 2018-10-13 LAB — POCT GLYCOSYLATED HEMOGLOBIN (HGB A1C): Hemoglobin A1C: 8.3 % — AB (ref 4.0–5.6)

## 2018-10-13 LAB — GLUCOSE, POCT (MANUAL RESULT ENTRY): POC Glucose: 278 mg/dl — AB (ref 70–99)

## 2018-10-13 MED ORDER — GLIPIZIDE 5 MG PO TABS: ORAL_TABLET | ORAL | 3 refills | Status: DC

## 2018-10-13 NOTE — Progress Notes (Signed)
Name: Ashley Merritt  Age/ Sex: 73 y.o., female   MRN/ DOB: [id number], 1945-07-01     PCP: Biagio Borg, MD   Reason for Endocrinology Evaluation: Type 2 Diabetes Mellitus  Initial Endocrine Consultative Visit: 07/14/2018    PATIENT IDENTIFIER: Ashley Merritt is a 73 y.o. female with a past medical history of HTN, GERD, T2DM and dyslipidemia. The patient has followed with Endocrinology clinic since 07/14/18 for consultative assistance with management of her diabetes.  DIABETIC HISTORY:  Ashley Merritt was diagnosed with T2DM in 1990 during routine workup. She was initially started on insulin but has been off for many years. She was on Metformin with GI side effects, SU, and Januvia. She went back to insulin briefly in 2014 due to hyperglycemia secondary to a back intra-articular injection. Her hemoglobin A1c has ranged from 7.1% in 2019, peaking at 8.1% in 2018.   SUBJECTIVE:   During the last visit (07/14/2018): A1 c 7.8% Increased Glipizide, continued Metformin and januvia   Today (10/13/2018): Ashley Merritt is here for a 3 month follow up on diabetes management. She checks her blood sugars occasionally, preprandial to breakfast and dinner. The patient has not had hypoglycemic episodes since the last clinic visit. Otherwise, the patient has not required any recent emergency interventions for hypoglycemia and has not had recent hospitalizations secondary to hyper or hypoglycemic episodes.   Pt has noted few glipizide tablets on the floor and believes she has been missing more doses .     ROS: As per HPI and as detailed below: Review of Systems  Eyes: Negative for blurred vision and pain.  Respiratory: Negative for cough and shortness of breath.   Cardiovascular: Negative for chest pain and palpitations.  Gastrointestinal: Negative for diarrhea and nausea.      HOME DIABETES REGIMEN:  Glipizide 5 mg BID Metformin 500 mg XR 1 tab BID  Januvia 100 mg daily     METER DOWNLOAD  SUMMARY: Date range evaluated: 7/16-8/14/20 Fingerstick Blood Glucose Tests = 10 Average Number Tests/Day = 0.3 Overall Mean FS Glucose = 187 Standard Deviation = 23.2  BG Ranges: Low = 145 High = 220   Hypoglycemic Events/30 Days: BG < 50 = 0 Episodes of symptomatic severe hypoglycemia = 0    HISTORY:  Past Medical History:  Past Medical History:  Diagnosis Date  . Diabetes mellitus without complication ([place])   . Hyperlipidemia   . Hypertension    Past Surgical History:  Past Surgical History:  Procedure Laterality Date  . SPINE SURGERY      Social History:  reports that she has never smoked. She has never used smokeless tobacco. She reports that she does not drink alcohol or use drugs. Family History:  Family History  Problem Relation Age of Onset  . Diabetes Sister   . Diabetes Brother      HOME MEDICATIONS: Allergies as of 10/13/2018      Reactions   Amlodipine Itching   Scalp itching and hair loss   Aspirin    Other reaction(s): Abdominal Pain   Metformin    REACTION: gi upset even 1 x 500 mg   Sertraline Hcl    REACTION: "couldn"t think"      Medication List       Accurate as of October 13, 2018 10:32 AM. If you have any questions, ask your nurse or doctor.        Accu-Chek FastClix Lancets Misc 2 (two) times daily. for testing  atorvastatin 10 MG tablet Commonly known as: LIPITOR Take 10 mg by mouth daily.   azelastine 0.1 % nasal spray Commonly known as: ASTELIN PLACE 1 2 SPRAYS INTO BOTH NOSTRILS 2 (TWO) TIMES DAILY   D2000 Ultra Strength 50 MCG (2000 UT) Caps Generic drug: Cholecalciferol Take by mouth.   glipiZIDE 5 MG tablet Commonly known as: GLUCOTROL Take 1 tablet (5 mg total) by mouth daily before breakfast AND 2 tablets (10 mg total) daily before supper. What changed: See the new instructions. Changed by: Dorita Sciara, MD   hydrochlorothiazide 12.5 MG tablet Commonly known as: HYDRODIURIL Take 12.5 mg by mouth  daily.   irbesartan 300 MG tablet Commonly known as: AVAPRO daily. as directed   Januvia 100 MG tablet Generic drug: sitaGLIPtin Take 1 tablet (100 mg total) by mouth daily.   levocetirizine 5 MG tablet Commonly known as: XYZAL every evening.   metFORMIN 500 MG 24 hr tablet Commonly known as: GLUCOPHAGE-XR   mirtazapine 7.5 MG tablet Commonly known as: REMERON Take 1 tablet (7.5 mg total) by mouth at bedtime.   Polyethyl Glycol-Propyl Glycol 0.4-0.3 % Soln Apply to eye.   telmisartan 80 MG tablet Commonly known as: MICARDIS Take 1 tablet (80 mg total) by mouth daily.   [place] 2 Normal Misc Use as instructed. Please use FASTCLIX        OBJECTIVE:   Vital Signs: BP 122/80 (BP Location: Left Arm, Patient Position: Sitting, Cuff Size: Large)   Pulse 71   Temp 97.9 F (36.6 C)   Ht 5\' 3"  (1.6 m)   Wt 174 lb 3.2 oz (79 kg)   SpO2 98%   BMI 30.86 kg/m   Wt Readings from Last 3 Encounters:  10/13/18 174 lb 3.2 oz (79 kg)  08/11/18 167 lb (75.8 kg)  07/14/18 171 lb 6.4 oz (77.7 kg)     Exam: General: Pt appears well and is in NAD  Lungs: Clear with good BS bilat with no rales, rhonchi, or wheezes  Heart: RRR with normal S1 and S2 and no gallops; no murmurs; no rub  Abdomen: Normoactive bowel sounds, soft, nontender, without masses or organomegaly palpable  Extremities: No pretibial edema. No tremor.  Skin: Normal texture and temperature to palpation. No rash noted.  Neuro: MS is good with appropriate affect, pt is alert and Ox3       DM foot exam: 07/14/2018 The skin of the feet is intact without sores or ulcerations. The pedal pulses are 2+ on right and 2+ on left. The sensation is intact to a screening 5.07, 10 gram monofilament bilaterally         DATA REVIEWED:  Lab Results  Component Value Date   HGBA1C 8.3 (A) 10/13/2018   HGBA1C 7.8 (A) 07/14/2018   HGBA1C 6.3 (H) 08/07/2007   Lab Results  Component Value Date   MICROALBUR <0.7  07/14/2018   LDLCALC 68 07/14/2018   CREATININE 0.87 07/14/2018   Lab Results  Component Value Date   MICRALBCREAT 1.2 07/14/2018     Lab Results  Component Value Date   CHOL 155 07/14/2018   HDL 73.40 07/14/2018   LDLCALC 68 07/14/2018   TRIG 69.0 07/14/2018   CHOLHDL 2 07/14/2018         ASSESSMENT / PLAN / RECOMMENDATIONS:   1) Type 2 Diabetes Mellitus, Sub-Optimally controlled, Without complications - Most recent A1c of 8.3 %. Goal A1c < 7.0 %.    - Worsening glucose control due to medication non-compliance.  -  Encouraged pt to check glucose at home more often.  - Will increase Glipizide dose with supper as her morning readings tend to be higher   MEDICATIONS:  Glipizide 5 mg, 1 tablet before breakfast and 2 tablets before Supper  Continue Metformin 500 mg XR 1 tablet BID  Januvia 100 mg daily   EDUCATION / INSTRUCTIONS:  BG monitoring instructions: Patient is instructed to check her blood sugars 2 times a day, Before breakfast and supper time .  Call [facility] [facility] if: BG persistently < 70 or > 300. . I reviewed the Rule of 15 for the treatment of hypoglycemia in detail with the patient. Literature supplied.   F/U in 3 months   Signed electronically by: Mack Guise, MD  [facility]  [facility] [address] [place], [place] [address] Phone: [phone] FAX: [phone]   CC: Biagio Borg, MD [place] [address] Phone: [phone]  Fax: [phone]  Return to Endocrinology clinic as below: Future Appointments  Date Time Provider [place]  01/15/2019 10:30 AM , Melanie Crazier, MD [facility] None  02/13/2019 10:20 AM Biagio Borg, MD [facility]

## 2018-10-13 NOTE — Patient Instructions (Signed)
-   Glipizide 5 mg , take 1 tablet before Breakfast and 2 tablets before Supper - Continue Metformin 500 mg XR Twice a day with meals - Continue Januvia 100 mg once daily     HOW TO TREAT LOW BLOOD SUGARS (Blood sugar LESS THAN 70 MG/DL)  Please follow the RULE OF 15 for the treatment of hypoglycemia treatment (when your (blood sugars are less than 70 mg/dL)    STEP 1: Take 15 grams of carbohydrates when your blood sugar is low, which includes:   3-4 GLUCOSE TABS  OR  3-4 OZ OF JUICE OR REGULAR SODA OR  ONE TUBE OF GLUCOSE GEL     STEP 2: RECHECK blood sugar in 15 MINUTES STEP 3: If your blood sugar is still low at the 15 minute recheck --> then, go back to STEP 1 and treat AGAIN with another 15 grams of carbohydrates.

## 2018-10-31 ENCOUNTER — Telehealth: Payer: Self-pay | Admitting: Internal Medicine

## 2018-10-31 NOTE — Telephone Encounter (Signed)
Please advise 

## 2018-10-31 NOTE — Telephone Encounter (Signed)
Patient has called stating her metFORMIN (GLUCOPHAGE-XR) 500 MG 24 hr tablet has been recalled.   Please Advise, Thanks

## 2018-11-01 NOTE — Telephone Encounter (Signed)
Pt stated that she had received a letter, I informed the pt to contact her pharmacy

## 2018-11-10 ENCOUNTER — Other Ambulatory Visit: Payer: Self-pay | Admitting: Internal Medicine

## 2018-11-10 MED ORDER — AZELASTINE HCL 0.1 % NA SOLN: NASAL | 5 refills | Status: DC

## 2018-11-10 NOTE — Telephone Encounter (Signed)
Requested medication (s) are due for refill today: yes  Requested medication (s) are on the active medication list: yes  Last refill:  06/2018  Future visit scheduled: no  Notes to clinic:  Review for refill   Requested Prescriptions  Pending Prescriptions Disp Refills   azelastine (ASTELIN) 0.1 % nasal spray 30 mL     Sig: Use in each nostril as directed     Ear, Nose, and Throat: Nasal Preparations - Antiallergy Passed - 11/10/2018 10:52 AM      Passed - Valid encounter within last 12 months    Recent Outpatient Visits          3 months ago Need for hepatitis C screening test   [facility] -Georges Mouse, MD      Future Appointments            In 3 months Ashley Merritt, Ashley Oris, MD [place]

## 2018-11-10 NOTE — Telephone Encounter (Signed)
Medication Refill - Medication: azelastine (ASTELIN) 0.1 % nasal spray   Has the patient contacted their pharmacy? Yes.   (Agent: If no, request that the patient contact the pharmacy for the refill.) (Agent: If yes, when and what did the pharmacy advise?)  Preferred Pharmacy (with phone number or street name):  [id number] - [place], [place] [address]  Phone: [phone] Fax: [phone]     Agent: Please be advised that RX refills may take up to 3 business days. We ask that you follow-up with your pharmacy.

## 2018-11-11 ENCOUNTER — Other Ambulatory Visit: Payer: Self-pay | Admitting: Internal Medicine

## 2018-12-22 ENCOUNTER — Ambulatory Visit (INDEPENDENT_AMBULATORY_CARE_PROVIDER_SITE_OTHER): Payer: Medicare Other | Admitting: Podiatry

## 2018-12-22 ENCOUNTER — Encounter: Payer: Self-pay | Admitting: Podiatry

## 2018-12-22 ENCOUNTER — Other Ambulatory Visit: Payer: Self-pay

## 2018-12-22 DIAGNOSIS — M79674 Pain in right toe(s): Secondary | ICD-10-CM

## 2018-12-22 DIAGNOSIS — B351 Tinea unguium: Secondary | ICD-10-CM

## 2018-12-22 DIAGNOSIS — M79675 Pain in left toe(s): Secondary | ICD-10-CM

## 2018-12-22 NOTE — Progress Notes (Signed)
Complaint:  Visit Type: Patient returns to my office for continued preventative foot care services. Complaint: Patient states" my nails have grown long and thick and become painful to walk and wear shoes" . The patient presents for preventative foot care services.  Podiatric Exam: Vascular: dorsalis pedis and posterior tibial pulses are palpable bilateral. Capillary return is immediate. Temperature gradient is WNL. Skin turgor WNL  Sensorium: Normal Semmes Weinstein monofilament test. Normal tactile sensation bilaterally. Nail Exam: Pt has thick disfigured discolored nails with subungual debris noted bilateral entire nail hallux through fifth toenails Ulcer Exam: There is no evidence of ulcer or pre-ulcerative changes or infection. Orthopedic Exam: Muscle tone and strength are WNL. No limitations in general ROM. No crepitus or effusions noted. Foot type and digits show no abnormalities. Bony prominences are unremarkable. Skin: No Porokeratosis. No infection or ulcers.  Scar over right big toe.  Diagnosis:  Onychomycosis, , Pain in right toe, pain in left toes  Treatment & Plan Procedures and Treatment: Consent by patient was obtained for treatment procedures.   Debridement of mycotic and hypertrophic toenails, 1 through 5 bilateral and clearing of subungual debris. No ulceration, no infection noted.  Return Visit-Office Procedure: Patient instructed to return to the office for a follow up visit 3 months for continued evaluation and treatment.    Gardiner Barefoot DPM

## 2019-01-11 ENCOUNTER — Other Ambulatory Visit: Payer: Self-pay

## 2019-01-15 ENCOUNTER — Encounter: Payer: Self-pay | Admitting: Internal Medicine

## 2019-01-15 ENCOUNTER — Ambulatory Visit (INDEPENDENT_AMBULATORY_CARE_PROVIDER_SITE_OTHER): Payer: Medicare Other | Admitting: Internal Medicine

## 2019-01-15 VITALS — BP 132/80 | HR 73 | Resp 18 | Ht 63.0 in | Wt 175.2 lb

## 2019-01-15 DIAGNOSIS — E1165 Type 2 diabetes mellitus with hyperglycemia: Secondary | ICD-10-CM | POA: Diagnosis not present

## 2019-01-15 LAB — POCT GLYCOSYLATED HEMOGLOBIN (HGB A1C): Hemoglobin A1C: 8.6 % — AB (ref 4.0–5.6)

## 2019-01-15 NOTE — Progress Notes (Signed)
Name: Ashley Merritt  Age/ Sex: 73 y.o., female   MRN/ DOB: [id number], 11/03/1945     PCP: Biagio Borg, MD   Reason for Endocrinology Evaluation: Type 2 Diabetes Mellitus  Initial Endocrine Consultative Visit: 07/14/2018    PATIENT IDENTIFIER: Ashley Merritt is a 73 y.o. female with a past medical history of HTN, GERD, T2DM and dyslipidemia. The patient has followed with Endocrinology clinic since 07/14/18 for consultative assistance with management of her diabetes.  DIABETIC HISTORY:  Ashley Merritt was diagnosed with T2DM in 1990 during routine workup. She was initially started on insulin but has been off for many years. She was on Metformin with GI side effects, SU, and Januvia. She went back to insulin briefly in 2014 due to hyperglycemia secondary to a back intra-articular injection. Her hemoglobin A1c has ranged from 7.1% in 2019, peaking at 8.1% in 2018.  SUBJECTIVE:   During the last visit (10/13/2018): A1 c 8.3 % Increased Glipizide, continued Metformin and januvia   Today (01/15/2019): Ms. [name] is here for a 3 month follow up on diabetes management. She checks her blood sugars 2x daily, preprandial to breakfast and dinner. The patient has not had hypoglycemic episodes since the last clinic visit. Otherwise, the patient has not required any recent emergency interventions for hypoglycemia and has not had recent hospitalizations secondary to hyper or hypoglycemic episodes.   Eats 2 meals a day, snacks at times.    ROS: As per HPI and as detailed below: Review of Systems  Eyes: Negative for blurred vision and pain.  Respiratory: Negative for cough and shortness of breath.   Cardiovascular: Negative for chest pain and palpitations.  Gastrointestinal: Negative for diarrhea and nausea.  Neurological: Positive for headaches.      HOME DIABETES REGIMEN:  Glipizide 5 mg with breakfast and 2 tabs with supper Metformin 500 mg XR 1 tab BID  Januvia 100 mg daily       METER DOWNLOAD SUMMARY: Date range evaluated: 10/18-11/16/2020 Fingerstick Blood Glucose Tests = 48 Average Number Tests/Day = 1.6 Overall Mean FS Glucose = 205 Standard Deviation = 50.2  BG Ranges: Low = 90 High = 333   Hypoglycemic Events/30 Days: BG < 50 = 0 Episodes of symptomatic severe hypoglycemia = 0    HISTORY:  Past Medical History:  Past Medical History:  Diagnosis Date  . Diabetes mellitus without complication ([place])   . Hyperlipidemia   . Hypertension    Past Surgical History:  Past Surgical History:  Procedure Laterality Date  . SPINE SURGERY      Social History:  reports that she has never smoked. She has never used smokeless tobacco. She reports that she does not drink alcohol or use drugs. Family History:  Family History  Problem Relation Age of Onset  . Diabetes Sister   . Diabetes Brother      HOME MEDICATIONS: Allergies as of 01/15/2019      Reactions   Amlodipine Itching   Scalp itching and hair loss   Aspirin    Other reaction(s): Abdominal Pain   Metformin    REACTION: gi upset even 1 x 500 mg   Sertraline Hcl    REACTION: "couldn"t think"      Medication List       Accurate as of January 15, 2019  7:13 AM. If you have any questions, ask your nurse or doctor.        Accu-Chek FastClix Lancets Misc 2 (two) times daily. for  testing   atorvastatin 10 MG tablet Commonly known as: LIPITOR Take 10 mg by mouth daily.   azelastine 0.1 % nasal spray Commonly known as: ASTELIN Place into the nose.   D2000 Ultra Strength 50 MCG (2000 UT) Caps Generic drug: Cholecalciferol Take by mouth.   glipiZIDE 5 MG tablet Commonly known as: GLUCOTROL Take 1 tablet (5 mg total) by mouth daily before breakfast AND 2 tablets (10 mg total) daily before supper.   hydrochlorothiazide 12.5 MG tablet Commonly known as: HYDRODIURIL Take 12.5 mg by mouth daily.   irbesartan 300 MG tablet Commonly known as: AVAPRO USE AS DIRECTED DAILY    Januvia 100 MG tablet Generic drug: sitaGLIPtin Take 1 tablet (100 mg total) by mouth daily.   levocetirizine 5 MG tablet Commonly known as: XYZAL every evening.   metFORMIN 500 MG 24 hr tablet Commonly known as: GLUCOPHAGE-XR   mirtazapine 7.5 MG tablet Commonly known as: REMERON Take 1 tablet (7.5 mg total) by mouth at bedtime.   Polyethyl Glycol-Propyl Glycol 0.4-0.3 % Soln Apply to eye.   telmisartan 80 MG tablet Commonly known as: MICARDIS Take 1 tablet (80 mg total) by mouth daily.   UNABLE TO FIND Take by mouth.   UNABLE TO FIND Use 3 (three) times daily E11.9 diagnosis code   UNABLE TO FIND Use 1 each 2 (two) times daily Use as instructed.        OBJECTIVE:   Vital Signs: There were no vitals taken for this visit.  Wt Readings from Last 3 Encounters:  10/13/18 174 lb 3.2 oz (79 kg)  08/11/18 167 lb (75.8 kg)  07/14/18 171 lb 6.4 oz (77.7 kg)     Exam: General: Pt appears well and is in NAD  Lungs: Clear with good BS bilat with no rales, rhonchi, or wheezes  Heart: RRR with normal S1 and S2 and no gallops; no murmurs; no rub  Abdomen: Normoactive bowel sounds, soft, nontender, without masses or organomegaly palpable  Extremities: No pretibial edema. No tremor.  Skin: Normal texture and temperature to palpation. No rash noted.  Neuro: MS is good with appropriate affect, pt is alert and Ox3       DM foot exam: 07/14/2018 The skin of the feet is intact without sores or ulcerations. The pedal pulses are 2+ on right and 2+ on left. The sensation is intact to a screening 5.07, 10 gram monofilament bilaterally         DATA REVIEWED:  Lab Results  Component Value Date   HGBA1C 8.3 (A) 10/13/2018   HGBA1C 7.8 (A) 07/14/2018   HGBA1C 6.3 (H) 08/07/2007   Lab Results  Component Value Date   MICROALBUR <0.7 07/14/2018   LDLCALC 68 07/14/2018   CREATININE 0.87 07/14/2018   Lab Results  Component Value Date   MICRALBCREAT 1.2 07/14/2018      Lab Results  Component Value Date   CHOL 155 07/14/2018   HDL 73.40 07/14/2018   LDLCALC 68 07/14/2018   TRIG 69.0 07/14/2018   CHOLHDL 2 07/14/2018         ASSESSMENT / PLAN / RECOMMENDATIONS:   1) Type 2 Diabetes Mellitus, Sub-Optimally controlled, Without complications - Most recent A1c of 8.3 %. Goal A1c < 7.0 %.    - Worsening glucose control , will adjust glipizide as below - Discussed the importance of dietary indiscretions in diabetes control    MEDICATIONS:  Glipizide 5 mg, 2 tablets before breakfast and 3 tablets before Supper  Continue Metformin  500 mg XR 1 tablet BID  Januvia 100 mg daily   EDUCATION / INSTRUCTIONS:  BG monitoring instructions: Patient is instructed to check her blood sugars 2 times a day, Before breakfast and supper time .  Call [place] [facility] if: BG persistently < 70 or > 300. . I reviewed the Rule of 15 for the treatment of hypoglycemia in detail with the patient. Literature supplied.   F/U in 3 months   Signed electronically by: Mack Guise, MD  [facility]  [facility] [address] [place], [place] [address] Phone: [phone] FAX: [phone]   CC: Biagio Borg, MD [place] [address] Phone: [phone]  Fax: [phone]  Return to Endocrinology clinic as below: Future Appointments  Date Time Provider [place]  01/15/2019 10:30 AM , Melanie Crazier, MD [facility] None  02/13/2019 10:20 AM Biagio Borg, MD [facility]

## 2019-01-15 NOTE — Patient Instructions (Addendum)
-   Glipizide 5 mg , 2 tablets before Breakfast and 3 tablets with Supper  - Continue metformin 500 mg Twice daily  - Continue Januvia 100 mg daily     HOW TO TREAT LOW BLOOD SUGARS (Blood sugar LESS THAN 70 MG/DL)  Please follow the RULE OF 15 for the treatment of hypoglycemia treatment (when your (blood sugars are less than 70 mg/dL)    STEP 1: Take 15 grams of carbohydrates when your blood sugar is low, which includes:   3-4 GLUCOSE TABS  OR  3-4 OZ OF JUICE OR REGULAR SODA OR  ONE TUBE OF GLUCOSE GEL     STEP 2: RECHECK blood sugar in 15 MINUTES STEP 3: If your blood sugar is still low at the 15 minute recheck --> then, go back to STEP 1 and treat AGAIN with another 15 grams of carbohydrates.

## 2019-02-13 ENCOUNTER — Encounter: Payer: Self-pay | Admitting: Internal Medicine

## 2019-02-13 ENCOUNTER — Other Ambulatory Visit: Payer: Self-pay

## 2019-02-13 ENCOUNTER — Ambulatory Visit: Payer: Medicare Other | Admitting: Internal Medicine

## 2019-02-13 ENCOUNTER — Ambulatory Visit (INDEPENDENT_AMBULATORY_CARE_PROVIDER_SITE_OTHER): Payer: Medicare Other | Admitting: Internal Medicine

## 2019-02-13 VITALS — BP 150/70 | HR 82 | Temp 98.2°F | Ht 63.0 in | Wt 175.0 lb

## 2019-02-13 DIAGNOSIS — E119 Type 2 diabetes mellitus without complications: Secondary | ICD-10-CM

## 2019-02-13 DIAGNOSIS — E559 Vitamin D deficiency, unspecified: Secondary | ICD-10-CM | POA: Insufficient documentation

## 2019-02-13 DIAGNOSIS — G8929 Other chronic pain: Secondary | ICD-10-CM | POA: Diagnosis not present

## 2019-02-13 DIAGNOSIS — M7062 Trochanteric bursitis, left hip: Secondary | ICD-10-CM

## 2019-02-13 DIAGNOSIS — I10 Essential (primary) hypertension: Secondary | ICD-10-CM

## 2019-02-13 DIAGNOSIS — F418 Other specified anxiety disorders: Secondary | ICD-10-CM | POA: Diagnosis not present

## 2019-02-13 DIAGNOSIS — M5136 Other intervertebral disc degeneration, lumbar region: Secondary | ICD-10-CM

## 2019-02-13 DIAGNOSIS — M7061 Trochanteric bursitis, right hip: Secondary | ICD-10-CM | POA: Diagnosis not present

## 2019-02-13 DIAGNOSIS — M5442 Lumbago with sciatica, left side: Secondary | ICD-10-CM | POA: Diagnosis not present

## 2019-02-13 DIAGNOSIS — M5441 Lumbago with sciatica, right side: Secondary | ICD-10-CM | POA: Diagnosis not present

## 2019-02-13 MED ORDER — IRBESARTAN 300 MG PO TABS: ORAL_TABLET | ORAL | 3 refills | Status: DC

## 2019-02-13 MED ORDER — TRAMADOL HCL 50 MG PO TABS: 50.0000 mg | ORAL_TABLET | Freq: Four times a day (QID) | ORAL | 0 refills | Status: DC | PRN

## 2019-02-13 NOTE — Patient Instructions (Signed)
Please take all new medication as prescribed - the pain medication  Please call in 1 week if you need further pain medication (tramadol)  Please continue all other medications as before, including taking the mirtazipine every night  Please have the pharmacy call with any other refills you may need.  Please continue your efforts at being more active, low cholesterol diet, and weight control.  Please keep your appointments with your specialists as you may have planned  You will be contacted regarding the referral for: MRI for the lower back, and Dr Amalia Hailey for sports medicine  Your SCAT form was filled out  Please return in 6 months, or sooner if needed

## 2019-02-13 NOTE — Progress Notes (Signed)
Subjective:    Patient ID: Ashley Merritt, female    DOB: 14-May-1945, 73 y.o.   MRN: [id number]  HPI  Here to f/u; overall doing ok,  Pt denies chest pain, increasing sob or doe, wheezing, orthopnea, PND, increased LE swelling, palpitations, dizziness or syncope.  Pt denies new neurological symptoms such as new headache, or facial or extremity weakness or numbness.  Pt denies polydipsia, polyuria, or low sugar episode.  Pt states overall good compliance with meds, mostly trying to follow appropriate diet, with wt overall stable,  but little exercise however.  Not taking the remeron every night due to fear of not being able to awaken easily the next am, but has had no actual problem with this so far. Pt continues to have recurring LBP without change in severity, bowel or bladder change, fever, wt loss,  But has worsening LE pain without numbness/weakness, gait change or falls, last PT in 2018. No recent MRI . Taking the Vitamin D replacement.  Has had mild worsening depressive symptoms, no suicidal ideation, or panic; has ongoing anxiety Past Medical History:  Diagnosis Date  . Diabetes mellitus without complication ([place])   . Hyperlipidemia   . Hypertension    Past Surgical History:  Procedure Laterality Date  . SPINE SURGERY      reports that she has never smoked. She has never used smokeless tobacco. She reports that she does not drink alcohol or use drugs. family history includes Diabetes in her brother and sister. Allergies  Allergen Reactions  . Amlodipine Itching    Scalp itching and hair loss  . Aspirin     Other reaction(s): Abdominal Pain  . Metformin     REACTION: gi upset even 1 x 500 mg  . Sertraline Hcl     REACTION: "couldn"t think"   Current Outpatient Medications on File Prior to Visit  Medication Sig Dispense Refill  . Accu-Chek FastClix Lancets MISC 2 (two) times daily. for testing    . atorvastatin (LIPITOR) 10 MG tablet Take 10 mg by mouth daily.    Marland Kitchen azelastine  (ASTELIN) 0.1 % nasal spray Place into the nose.    . Cholecalciferol (D2000 ULTRA STRENGTH) 50 MCG (2000 UT) CAPS Take by mouth.    Marland Kitchen glipiZIDE (GLUCOTROL) 5 MG tablet Take 1 tablet (5 mg total) by mouth daily before breakfast AND 2 tablets (10 mg total) daily before supper. (Patient taking differently: Take 2 tablet  by mouth daily before breakfast AND 3 tablets daily before supper.) 270 tablet 3  . hydrochlorothiazide (HYDRODIURIL) 12.5 MG tablet Take 12.5 mg by mouth daily.    Marland Kitchen JANUVIA 100 MG tablet Take 1 tablet (100 mg total) by mouth daily. 90 tablet 3  . levocetirizine (XYZAL) 5 MG tablet every evening.    . metFORMIN (GLUCOPHAGE-XR) 500 MG 24 hr tablet     . mirtazapine (REMERON) 7.5 MG tablet Take 1 tablet (7.5 mg total) by mouth at bedtime. 90 tablet 3  . Polyethyl Glycol-Propyl Glycol 0.4-0.3 % SOLN Apply to eye.    . telmisartan (MICARDIS) 80 MG tablet Take 1 tablet (80 mg total) by mouth daily. 90 tablet 3   No current facility-administered medications on file prior to visit.   Review of Systems  Constitutional: Negative for other unusual diaphoresis or sweats HENT: Negative for ear discharge or swelling Eyes: Negative for other worsening visual disturbances Respiratory: Negative for stridor or other swelling  Gastrointestinal: Negative for worsening distension or other blood Genitourinary: Negative for  retention or other urinary change Musculoskeletal: Negative for other MSK pain or swelling Skin: Negative for color change or other new lesions Neurological: Negative for worsening tremors and other numbness  Psychiatric/Behavioral: Negative for worsening agitation or other fatigue All otherwise neg per pt     Objective:   Physical Exam BP (!) 150/70   Pulse 82   Temp 98.2 F (36.8 C) (Oral)   Ht 5\' 3"  (1.6 m)   Wt 175 lb (79.4 kg)   SpO2 97%   BMI 31.00 kg/m  VS noted,  Constitutional: Pt appears in NAD HENT: Head: NCAT.  Right Ear: External ear normal.  Left  Ear: External ear normal.  Eyes: . Pupils are equal, round, and reactive to light. Conjunctivae and EOM are normal Nose: without d/c or deformity Neck: Neck supple. Gross normal ROM Cardiovascular: Normal rate and regular rhythm.   Pulmonary/Chest: Effort normal and breath sounds without rales or wheezing.  Abd:  Soft, NT, ND, + BS, no organomegaly Neurological: Pt is alert. At baseline orientation, motor grossly intact Skin: Skin is warm. No rashes, other new lesions, no LE edema Psychiatric: Pt behavior is normal without agitation , anxious depressed All otherwise neg per pt Lab Results  Component Value Date   WBC 3.3 (L) 10/10/2006   HGB 12.5 10/10/2006   HCT 37.7 10/10/2006   PLT 121 (L) 10/10/2006   GLUCOSE 235 (H) 07/14/2018   CHOL 155 07/14/2018   TRIG 69.0 07/14/2018   HDL 73.40 07/14/2018   LDLCALC 68 07/14/2018   ALT 11 10/10/2006   AST 15 10/10/2006   NA 138 07/14/2018   K 4.0 07/14/2018   CL 100 07/14/2018   CREATININE 0.87 07/14/2018   BUN 17 07/14/2018   CO2 29 07/14/2018   TSH 1.09 08/11/2018   HGBA1C 8.6 (A) 01/15/2019   MICROALBUR <0.7 07/14/2018         Assessment & Plan:

## 2019-02-17 ENCOUNTER — Encounter: Payer: Self-pay | Admitting: Internal Medicine

## 2019-02-17 NOTE — Assessment & Plan Note (Signed)
For sport medicine referral, to f/u any worsening symptoms or concerns

## 2019-02-17 NOTE — Assessment & Plan Note (Signed)
To continue oral replacement,  to f/u any worsening symptoms or concerns

## 2019-02-17 NOTE — Assessment & Plan Note (Signed)
Encouraged to take the remeron 7.5 qhs as recommended,  to f/u any worsening symptoms or concerns

## 2019-02-17 NOTE — Assessment & Plan Note (Signed)
stable overall by history and exam, recent data reviewed with pt, and pt to continue medical treatment as before,  to f/u any worsening symptoms or concerns, f/u endo as planned

## 2019-02-17 NOTE — Assessment & Plan Note (Addendum)
For sport med referral, MRI, SCAT form filled out, tramadol prn,  to f/u any worsening symptoms or concerns  In addition to the time spent performing CPE, I spent an additional 25 minutes face to face,in which greater than 50% of this time was spent in counseling and coordination of care for patient's acute illness as documented, including the differential dx, treatment, further evaluation and other management of back pain, bursitis hips, depression anxiety, HTN, DM, vit d deficiency

## 2019-02-17 NOTE — Assessment & Plan Note (Signed)
Mild uncontrolled, declines med change for better control,  to f/u any worsening symptoms or concerns

## 2019-02-20 ENCOUNTER — Other Ambulatory Visit: Payer: Self-pay

## 2019-02-20 ENCOUNTER — Telehealth: Payer: Self-pay | Admitting: Internal Medicine

## 2019-02-20 MED ORDER — GLIPIZIDE 5 MG PO TABS: ORAL_TABLET | ORAL | 3 refills | Status: DC

## 2019-02-20 NOTE — Telephone Encounter (Signed)
MEDICATION: Glipizide  PHARMACY:  [facility] on [address] :   IS PATIENT OUT OF MEDICATION: no   IF NOT; HOW MUCH IS LEFT: 2 days left  LAST APPOINTMENT DATE: @11 /16/2020  NEXT APPOINTMENT DATE:@2 /17/2021  DO WE HAVE YOUR PERMISSION TO LEAVE A DETAILED MESSAGE: yes  OTHER COMMENTS:    **Let patient know to contact pharmacy at the end of the day to make sure medication is ready. **  ** Please notify patient to allow 48-72 hours to process**  **Encourage patient to contact the pharmacy for refills or they can request refills through [facility]**

## 2019-03-05 ENCOUNTER — Telehealth: Payer: Self-pay

## 2019-03-05 ENCOUNTER — Other Ambulatory Visit: Payer: Self-pay

## 2019-03-05 ENCOUNTER — Ambulatory Visit
Admission: RE | Admit: 2019-03-05 | Discharge: 2019-03-05 | Disposition: A | Discharge status: Home / Self Care | Payer: Medicare Other | Source: Ambulatory Visit | Attending: Internal Medicine | Admitting: Internal Medicine

## 2019-03-05 ENCOUNTER — Encounter: Payer: Self-pay | Admitting: Internal Medicine

## 2019-03-05 DIAGNOSIS — M5136 Other intervertebral disc degeneration, lumbar region: Secondary | ICD-10-CM

## 2019-03-05 NOTE — Telephone Encounter (Signed)
Copied from [place] [id number]. Topic: General - Other >> Mar 05, 2019  3:53 PM Leward Quan A wrote: Reason for CRM: Patient called to say that she is having urinary frequency and pain at the end of her stream asking for a call back from Dr Jenny Reichmann or an Rx sent to the pharmacy. Can be reached at Ph# [phone]

## 2019-03-06 NOTE — Telephone Encounter (Signed)
Please advise 

## 2019-03-06 NOTE — Telephone Encounter (Signed)
Ok for OV to assess   I have to cut back on free care for pts as my wife is complaining and depressed regarding the time it requires

## 2019-03-06 NOTE — Telephone Encounter (Signed)
Sent to front office ---([place] appointment for the pt

## 2019-03-07 ENCOUNTER — Encounter: Payer: Self-pay | Admitting: Internal Medicine

## 2019-03-07 ENCOUNTER — Other Ambulatory Visit: Payer: Self-pay

## 2019-03-07 ENCOUNTER — Ambulatory Visit (INDEPENDENT_AMBULATORY_CARE_PROVIDER_SITE_OTHER): Payer: Medicare Other | Admitting: Internal Medicine

## 2019-03-07 VITALS — BP 140/82 | HR 82 | Temp 98.3°F | Resp 14 | Ht 63.0 in | Wt 173.2 lb

## 2019-03-07 DIAGNOSIS — I10 Essential (primary) hypertension: Secondary | ICD-10-CM | POA: Diagnosis not present

## 2019-03-07 DIAGNOSIS — R300 Dysuria: Secondary | ICD-10-CM | POA: Diagnosis not present

## 2019-03-07 DIAGNOSIS — E119 Type 2 diabetes mellitus without complications: Secondary | ICD-10-CM

## 2019-03-07 DIAGNOSIS — F418 Other specified anxiety disorders: Secondary | ICD-10-CM | POA: Diagnosis not present

## 2019-03-07 LAB — URINALYSIS, ROUTINE W REFLEX MICROSCOPIC
Bilirubin Urine: NEGATIVE
Ketones, ur: NEGATIVE
Nitrite: NEGATIVE
Specific Gravity, Urine: 1.015 (ref 1.000–1.030)
Urine Glucose: >=1000 — AB
Urobilinogen, UA: 0.2 (ref 0.0–1.0)
pH: 6 (ref 5.0–8.0)

## 2019-03-07 MED ORDER — CEPHALEXIN 500 MG PO CAPS: 500.0000 mg | ORAL_CAPSULE | Freq: Three times a day (TID) | ORAL | 0 refills | Status: AC

## 2019-03-07 NOTE — Patient Instructions (Signed)
We will send the specimen for analysis and culture  Please take all new medication as prescribed - the antibiotic  Please continue all other medications as before, and refills have been done if requested.  Please have the pharmacy call with any other refills you may need.  Please continue your efforts at being more active, low cholesterol diet, and weight control.  Please keep your appointments with your specialists as you may have planned

## 2019-03-07 NOTE — Progress Notes (Signed)
Subjective:    Patient ID: Ashley Merritt, female    DOB: October 22, 1945, 74 y.o.   MRN: [id number]  HPI  Here with c/o 3 days onset dysuria with frequency, but Denies urinary symptoms such as urgency, flank pain, hematuria or n/v, fever, chills.  Pt denies chest pain, increased sob or doe, wheezing, orthopnea, PND, increased LE swelling, palpitations, dizziness or syncope.  Pt denies new neurological symptoms such as new headache, or facial or extremity weakness or numbness   Pt denies polydipsia, polyuria Denies worsening depressive symptoms, suicidal ideation, or panic Past Medical History:  Diagnosis Date  . Diabetes mellitus without complication ([place])   . Hyperlipidemia   . Hypertension    Past Surgical History:  Procedure Laterality Date  . SPINE SURGERY      reports that she has never smoked. She has never used smokeless tobacco. She reports that she does not drink alcohol or use drugs. family history includes Diabetes in her brother and sister. Allergies  Allergen Reactions  . Amlodipine Itching    Scalp itching and hair loss  . Aspirin     Other reaction(s): Abdominal Pain  . Metformin     REACTION: gi upset even 1 x 500 mg  . Sertraline Hcl     REACTION: "couldn"t think"   Current Outpatient Medications on File Prior to Visit  Medication Sig Dispense Refill  . Accu-Chek FastClix Lancets MISC 2 (two) times daily. for testing    . atorvastatin (LIPITOR) 10 MG tablet Take 10 mg by mouth daily.    Marland Kitchen azelastine (ASTELIN) 0.1 % nasal spray Place into the nose.    . Cholecalciferol (D2000 ULTRA STRENGTH) 50 MCG (2000 UT) CAPS Take by mouth.    . diphenhydramine-acetaminophen (TYLENOL PM) 25-500 MG TABS tablet Take 1 tablet by mouth at bedtime as needed.    Marland Kitchen glipiZIDE (GLUCOTROL) 5 MG tablet Take 2 tablet  by mouth daily before breakfast AND 3 tablets daily before supper. 270 tablet 3  . hydrochlorothiazide (HYDRODIURIL) 12.5 MG tablet Take 12.5 mg by mouth daily.    .  irbesartan (AVAPRO) 300 MG tablet USE AS DIRECTED DAILY 90 tablet 3  . JANUVIA 100 MG tablet Take 1 tablet (100 mg total) by mouth daily. 90 tablet 3  . levocetirizine (XYZAL) 5 MG tablet every evening.    . metFORMIN (GLUCOPHAGE-XR) 500 MG 24 hr tablet     . mirtazapine (REMERON) 7.5 MG tablet Take 1 tablet (7.5 mg total) by mouth at bedtime. 90 tablet 3  . Polyethyl Glycol-Propyl Glycol 0.4-0.3 % SOLN Apply to eye.    . telmisartan (MICARDIS) 80 MG tablet Take 1 tablet (80 mg total) by mouth daily. 90 tablet 3  . traMADol (ULTRAM) 50 MG tablet Take 1 tablet (50 mg total) by mouth every 6 (six) hours as needed. 30 tablet 0   No current facility-administered medications on file prior to visit.   Review of Systems  Constitutional: Negative for other unusual diaphoresis or sweats HENT: Negative for ear discharge or swelling Eyes: Negative for other worsening visual disturbances Respiratory: Negative for stridor or other swelling  Gastrointestinal: Negative for worsening distension or other blood Genitourinary: Negative for retention or other urinary change Musculoskeletal: Negative for other MSK pain or swelling Skin: Negative for color change or other new lesions Neurological: Negative for worsening tremors and other numbness  Psychiatric/Behavioral: Negative for worsening agitation or other fatigue All otherwise neg per pt     Objective:   Physical Exam  BP 140/82   Pulse 82   Temp 98.3 F (36.8 C)   Resp 14   Ht 5\' 3"  (1.6 m)   Wt 173 lb 3.2 oz (78.6 kg)   SpO2 98%   BMI 30.68 kg/m  VS noted,  Constitutional: Pt appears in NAD HENT: Head: NCAT.  Right Ear: External ear normal.  Left Ear: External ear normal.  Eyes: . Pupils are equal, round, and reactive to light. Conjunctivae and EOM are normal Nose: without d/c or deformity Neck: Neck supple. Gross normal ROM Cardiovascular: Normal rate and regular rhythm.   Pulmonary/Chest: Effort normal and breath sounds without  rales or wheezing.  Abd:  Soft, ND, + BS, no organomegaly with mild low mid abd tender without guarding or rebound, and no flank tender Neurological: Pt is alert. At baseline orientation, motor grossly intact Skin: Skin is warm. No rashes, other new lesions, no LE edema Psychiatric: Pt behavior is normal without agitation  All otherwise neg per pt Lab Results  Component Value Date   WBC 3.3 (L) 10/10/2006   HGB 12.5 10/10/2006   HCT 37.7 10/10/2006   PLT 121 (L) 10/10/2006   GLUCOSE 235 (H) 07/14/2018   CHOL 155 07/14/2018   TRIG 69.0 07/14/2018   HDL 73.40 07/14/2018   LDLCALC 68 07/14/2018   ALT 11 10/10/2006   AST 15 10/10/2006   NA 138 07/14/2018   K 4.0 07/14/2018   CL 100 07/14/2018   CREATININE 0.87 07/14/2018   BUN 17 07/14/2018   CO2 29 07/14/2018   TSH 1.09 08/11/2018   HGBA1C 8.6 (A) 01/15/2019   MICROALBUR <0.7 07/14/2018      Assessment & Plan:

## 2019-03-08 LAB — URINE CULTURE

## 2019-03-09 NOTE — Telephone Encounter (Signed)
appt scheduled and completed.

## 2019-03-11 ENCOUNTER — Encounter: Payer: Self-pay | Admitting: Internal Medicine

## 2019-03-11 DIAGNOSIS — R300 Dysuria: Secondary | ICD-10-CM | POA: Insufficient documentation

## 2019-03-11 NOTE — Assessment & Plan Note (Signed)
Mild to mod, prob uti, for urine studies, for antibx course,  to f/u any worsening symptoms or concerns

## 2019-03-11 NOTE — Assessment & Plan Note (Signed)
stable overall by history and exam, recent data reviewed with pt, and pt to continue medical treatment as before,  to f/u any worsening symptoms or concerns  

## 2019-03-12 ENCOUNTER — Encounter: Payer: Self-pay | Admitting: Family Medicine

## 2019-03-12 ENCOUNTER — Other Ambulatory Visit: Payer: Self-pay

## 2019-03-12 ENCOUNTER — Ambulatory Visit (INDEPENDENT_AMBULATORY_CARE_PROVIDER_SITE_OTHER): Payer: Medicare Other | Admitting: Family Medicine

## 2019-03-12 VITALS — BP 148/80 | HR 81 | Ht 63.0 in | Wt 175.2 lb

## 2019-03-12 DIAGNOSIS — G8929 Other chronic pain: Secondary | ICD-10-CM

## 2019-03-12 DIAGNOSIS — M7061 Trochanteric bursitis, right hip: Secondary | ICD-10-CM

## 2019-03-12 DIAGNOSIS — M5441 Lumbago with sciatica, right side: Secondary | ICD-10-CM | POA: Diagnosis not present

## 2019-03-12 DIAGNOSIS — S39012A Strain of muscle, fascia and tendon of lower back, initial encounter: Secondary | ICD-10-CM | POA: Diagnosis not present

## 2019-03-12 MED ORDER — TRAMADOL HCL 50 MG PO TABS: 50.0000 mg | ORAL_TABLET | Freq: Four times a day (QID) | ORAL | 0 refills | Status: DC | PRN

## 2019-03-12 NOTE — Patient Instructions (Addendum)
Thank you for coming in today. Make sure you listen to your voicemail as the home health agency will be calling.  Let me know if you do not get a call in about 1 week.  Recheck with me in about 1 month.  Return sooner if needed.  Use tramadol sparingly.   Try using heat for the back.

## 2019-03-12 NOTE — Progress Notes (Signed)
Subjective:    I'm seeing this patient as a consultation for:  Dr. Jenny Reichmann. Note will be routed back to referring provider/PCP.  CC: Low back pain and B hip pain  I, Molly Weber, LAT, ATC, am serving as scribe for Dr. Lynne Leader.  HPI: Pt is a 74 y/o female presenting w/ c/o low back pain and B hip pain x one year.  She has seen her PCP who ordered an MRI of her L-spine that pt had on 03/05/19.  She reports chronic LBP w/ B LE pain.  She denies any numbness/tingling or weakness into her B LEs.  She takes Tramadol prn for pain.  Currently, pt reports low back pain that radiates into her R glutes and lateral hips (R>L).  Aggravating factors include transition from forward flexion to extension, prolonged sitting, standing or walking.  She has tried Tylenol and topical pain-relieving cream.  She is currently taking Tramadol prn prescribed by Dr. Jenny Reichmann.  She con't to deny numbness/tingling in her B LEs but does have intermittent radiating pain into her LE to her knees and sporadically into her B lower legs.  Past medical history, Surgical history, Family history, Social history, Allergies, and medications have been entered into the medical record, reviewed.  History of lumbar fusion greater than 20 years ago.  Review of Systems: No headache, visual changes, nausea, vomiting, diarrhea, constipation, dizziness, abdominal pain, skin rash, fevers, chills, night sweats, weight loss, swollen lymph nodes, body aches, joint swelling, muscle aches, chest pain, shortness of breath, mood changes, visual or auditory hallucinations.   Objective:    Vitals:   03/12/19 0944  BP: (!) 148/80  Pulse: 81  SpO2: 94%   General: Well Developed, well nourished, and in no acute distress.  Neuro/Psych: Alert and oriented x3, extra-ocular muscles intact, able to move all 4 extremities, sensation grossly intact. Skin: Warm and dry, no rashes noted.  Respiratory: Not using accessory muscles, speaking in full sentences,  trachea midline.  Cardiovascular: Pulses palpable, no extremity edema. Abdomen: Does not appear distended. MSK:  L-spine: Nontender to spinal midline.  Tender palpation right lumbar paraspinal musculature. Limited lumbar motion pain with rotation and lateral flexion and forward flexion felt mostly on the right low back. Lower extremity strength is intact and equal except for noted below. Reflexes equal bilateral knees and ankles extremity. Sensation is intact throughout bilateral extremity.  Right hip: Normal-appearing normal motion. Tender palpation greater trochanter nontender otherwise. Hip abduction strength diminished 4/5.  External rotation strength diminished 4+/5. Pain with resisted external rotation and abduction.  Left hip normal motion.  Mild antalgic gait.  Lab and Radiology Results MR Lumbar Spine Wo Contrast  Result Date: 03/05/2019 CLINICAL DATA:  Low back pain radiating into the right hip and knee. History of lumbar fusion. EXAM: MRI LUMBAR SPINE WITHOUT CONTRAST TECHNIQUE: Multiplanar, multisequence MR imaging of the lumbar spine was performed. No intravenous contrast was administered. COMPARISON:  None. FINDINGS: Segmentation: Conventional anatomy assumed, with the last open disc space designated L5-S1. Alignment: There is moderate artifact associated with the surgical hardware at the L4-5 level. The alignment is near anatomic with a mild convex left scoliosis. Vertebrae: No worrisome osseous lesion, acute fracture or pars defect. As above, moderate artifact associated with the surgical hardware obscures portions of the lower lumbar anatomy.The visualized sacroiliac joints appear unremarkable. There are postsurgical changes in the left iliac bone attributed to bone harvesting. Conus medullaris: Extends to the L1-2 level and appears normal. Paraspinal and other soft tissues:  No significant paraspinal findings. Disc levels: Mild disc bulging at T10-11, T11-12 and T12-L1 without  resulting spinal stenosis or nerve root encroachment. L1-2: Mild disc bulging eccentric to the right. No spinal stenosis or nerve root encroachment. L2-3: Mild disc bulging and facet hypertrophy. No spinal stenosis or nerve root encroachment. L3-4: This level is largely obscured by artifact related to the L4-5 hardware. On the sagittal images, there is loss of disc height with annular disc bulging and endplate osteophytes. There is suspicion of spinal stenosis and asymmetric right-sided foraminal narrowing. L4-5: Status post laminectomy and posterior fusion with associated susceptibility artifact. Detail is limited, although the spinal canal appears adequately decompressed. No high-grade foraminal narrowing suspected. L5-S1: Disc height and hydration are maintained. The spinal canal and lateral recesses are patent. Foraminal cyst is limited by artifact from the fusion hardware, although no high-grade foraminal narrowing suspected. IMPRESSION: 1. Status post L4-5 laminectomy and posterior fusion. Detail is limited, although the spinal canal appears adequately decompressed. 2. 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 Although largely obscured by artifact, there is suspicion of spinal stenosis and asymmetric right-sided foraminal narrowing. Consider further evaluation with lumbar spine CT. 3. Mild spondylosis at the other levels as detailed above. No other significant spinal stenosis or nerve root encroachment identified. Electronically Signed   By: Richardean Sale M.D.   On: 03/05/2019 09:30   I, Lynne Leader, personally (independently) visualized and performed the interpretation of the images attached in this note.   Impression and Recommendations:    Assessment and Plan: 74 y.o. female with  Patient with multifactorial low back and lateral hip pain.  Patient describes an increase of her mild chronic low back pain starting in November mostly located the  right low back.  She additionally has pain in the lateral hip and some no diminished pain radiating below the level of the knee into the anterior calf.  1) the majority of the right low back pain is due to myofascial dysfunction and spasm.  This should have significant improvement with trial of home health physical therapy.  Will use home health therapy as patient has difficulty with transportation.  2) lateral hip pain: Hip abductor tendinitis/trochanteric bursitis.  Again this will benefit significantly with physical therapy services.  3) lumbar radiculopathy likely L4-L5 nerve root.  Unfortunately due to surgical hardware MRI does not show enough detail at these levels to confirm this diagnosis.  Fortunately this seems to be a lesser issue for her and is improving with treatment including tramadol.  If needed we will proceed with CT myelogram however this is an invasive test would like to avoid if possible.  We will continue limited tramadol.  I ordered a refill today.  Advised patient to try to wean and reduce the dose if able.  Check back in about a month.  Return sooner if needed.  PDMP reviewed during this encounter. Orders Placed This Encounter  Procedures  . Ambulatory referral to [facility]    Referral Priority:   Routine    Referral Type:   [place]    Referral Reason:   Specialty Services Required    Requested Specialty:   [address]    Number of Visits Requested:   1   Meds ordered this encounter  Medications  . traMADol (ULTRAM) 50 MG tablet    Sig: Take 1 tablet (50 mg total) by mouth every 6 (six) hours as needed.  Dispense:  30 tablet    Refill:  0    Discussed warning signs or symptoms. Please see discharge instructions. Patient expresses understanding.   The above documentation has been reviewed and is accurate and complete Lynne Leader

## 2019-03-16 ENCOUNTER — Telehealth: Payer: Self-pay | Admitting: Internal Medicine

## 2019-03-16 NOTE — Telephone Encounter (Signed)
Copied from [place]. Topic: Quick Communication - Home Health Verbal Orders >> Mar 16, 2019  4:00 PM Leward Quan A wrote: Caller/Agency: Beckie Busing / [facility] Number: [phone] ok to LM Requesting OT/PT/Skilled Nursing/Social Work/Speech Therapy: PT  Frequency: 1 w 1, 2 w4, 1w 1   for strengthening, safe transfer,  pain management,  home exercise and home safety

## 2019-03-19 NOTE — Telephone Encounter (Signed)
Verbal orders given  

## 2019-04-03 DIAGNOSIS — I10 Essential (primary) hypertension: Secondary | ICD-10-CM

## 2019-04-03 DIAGNOSIS — S335XXD Sprain of ligaments of lumbar spine, subsequent encounter: Secondary | ICD-10-CM

## 2019-04-03 DIAGNOSIS — M5416 Radiculopathy, lumbar region: Secondary | ICD-10-CM

## 2019-04-03 DIAGNOSIS — F418 Other specified anxiety disorders: Secondary | ICD-10-CM

## 2019-04-03 DIAGNOSIS — E119 Type 2 diabetes mellitus without complications: Secondary | ICD-10-CM

## 2019-04-03 DIAGNOSIS — Z9181 History of falling: Secondary | ICD-10-CM

## 2019-04-03 DIAGNOSIS — M7061 Trochanteric bursitis, right hip: Secondary | ICD-10-CM

## 2019-04-03 DIAGNOSIS — M5441 Lumbago with sciatica, right side: Secondary | ICD-10-CM | POA: Diagnosis not present

## 2019-04-03 DIAGNOSIS — Z981 Arthrodesis status: Secondary | ICD-10-CM

## 2019-04-03 DIAGNOSIS — Z7984 Long term (current) use of oral hypoglycemic drugs: Secondary | ICD-10-CM

## 2019-04-03 DIAGNOSIS — G8929 Other chronic pain: Secondary | ICD-10-CM | POA: Diagnosis not present

## 2019-04-03 DIAGNOSIS — M1991 Primary osteoarthritis, unspecified site: Secondary | ICD-10-CM

## 2019-04-05 ENCOUNTER — Telehealth: Payer: Self-pay | Admitting: Internal Medicine

## 2019-04-05 NOTE — Telephone Encounter (Signed)
States that patients has had a recent BP med change by Dr. Jenny Reichmann. Recent BP checks:  2/4 175/94 2/1 158/70 1/29 148/71  States patient is having a headache and increased arm pain in both shoulders.  States has informed patient to call office to schedule appt.

## 2019-04-05 NOTE — Telephone Encounter (Signed)
I have reached out to the patient.  She is scheduled for 2/5 for an in office visit.

## 2019-04-06 ENCOUNTER — Other Ambulatory Visit: Payer: Self-pay

## 2019-04-06 ENCOUNTER — Ambulatory Visit (INDEPENDENT_AMBULATORY_CARE_PROVIDER_SITE_OTHER): Payer: Medicare Other | Admitting: Internal Medicine

## 2019-04-06 ENCOUNTER — Encounter: Payer: Self-pay | Admitting: Internal Medicine

## 2019-04-06 VITALS — BP 172/90 | HR 72 | Temp 98.6°F | Ht 63.0 in | Wt 172.1 lb

## 2019-04-06 DIAGNOSIS — N390 Urinary tract infection, site not specified: Secondary | ICD-10-CM

## 2019-04-06 DIAGNOSIS — Z86010 Personal history of colonic polyps: Secondary | ICD-10-CM

## 2019-04-06 DIAGNOSIS — Z1231 Encounter for screening mammogram for malignant neoplasm of breast: Secondary | ICD-10-CM | POA: Diagnosis not present

## 2019-04-06 DIAGNOSIS — I10 Essential (primary) hypertension: Secondary | ICD-10-CM | POA: Diagnosis not present

## 2019-04-06 DIAGNOSIS — Z794 Long term (current) use of insulin: Secondary | ICD-10-CM

## 2019-04-06 DIAGNOSIS — E119 Type 2 diabetes mellitus without complications: Secondary | ICD-10-CM

## 2019-04-06 LAB — BASIC METABOLIC PANEL
BUN: 13 mg/dL (ref 6–23)
CO2: 30 mEq/L (ref 19–32)
Calcium: 10.1 mg/dL (ref 8.4–10.5)
Chloride: 103 mEq/L (ref 96–112)
Creatinine, Ser: 0.79 mg/dL (ref 0.40–1.20)
GFR: 86.73 mL/min (ref >60.00)
Glucose, Bld: 130 mg/dL — ABNORMAL HIGH (ref 70–99)
Potassium: 3.7 mEq/L (ref 3.5–5.1)
Sodium: 140 mEq/L (ref 135–145)

## 2019-04-06 LAB — LIPID PANEL
Cholesterol: 122 mg/dL (ref 0–200)
HDL: 69.1 mg/dL (ref >39.00)
LDL Cholesterol: 39 mg/dL (ref 0–99)
NonHDL: 52.42
Total CHOL/HDL Ratio: 2
Triglycerides: 69 mg/dL (ref 0.0–149.0)
VLDL: 13.8 mg/dL (ref 0.0–40.0)

## 2019-04-06 LAB — HEPATIC FUNCTION PANEL
ALT: 11 U/L (ref 0–35)
AST: 11 U/L (ref 0–37)
Albumin: 4.4 g/dL (ref 3.5–5.2)
Alkaline Phosphatase: 104 U/L (ref 39–117)
Bilirubin, Direct: 0.2 mg/dL (ref 0.0–0.3)
Total Bilirubin: 0.6 mg/dL (ref 0.2–1.2)
Total Protein: 7.3 g/dL (ref 6.0–8.3)

## 2019-04-06 LAB — HEMOGLOBIN A1C: Hgb A1c MFr Bld: 8.3 % — ABNORMAL HIGH (ref 4.6–6.5)

## 2019-04-06 MED ORDER — AMLODIPINE BESYLATE 5 MG PO TABS: 5.0000 mg | ORAL_TABLET | Freq: Every day | ORAL | 3 refills | Status: DC

## 2019-04-06 MED ORDER — METOPROLOL SUCCINATE ER 50 MG PO TB24: 50.0000 mg | ORAL_TABLET | Freq: Every day | ORAL | 3 refills | Status: AC

## 2019-04-06 MED ORDER — METOPROLOL SUCCINATE ER 50 MG PO TB24: 50.0000 mg | ORAL_TABLET | Freq: Every day | ORAL | 3 refills | Status: DC

## 2019-04-06 NOTE — Progress Notes (Signed)
Subjective:    Patient ID: Ashley Merritt, female    DOB: 04-11-1945, 74 y.o.   MRN: [id number]  HPI  Here to f/u; overall doing ok,  Pt denies chest pain, increasing sob or doe, wheezing, orthopnea, PND, increased LE swelling, palpitations, dizziness or syncope.  Pt denies new neurological symptoms such as new headache, or facial or extremity weakness or numbness.  Pt denies polydipsia, polyuria, or low sugar episode.  Pt states overall good compliance with meds, mostly trying to follow appropriate diet, with wt overall stable, BPs at home have been sbp 145 - 175, has not been able to tolerate the HCT and amlodipine in the past. Per care everywhere, pt due for colonoscopy June 2020 after last done June 2017 in the [facility] system with f/u at 3 yrs.  Plans to call for appt at [facility] next wk for Covid shot #1.   Denies urinary symptoms such as dysuria, frequency, urgency, flank pain, hematuria or n/v, fever, chills. Past Medical History:  Diagnosis Date  . Diabetes mellitus without complication ([place])   . Hyperlipidemia   . Hypertension    Past Surgical History:  Procedure Laterality Date  . SPINE SURGERY      reports that she has never smoked. She has never used smokeless tobacco. She reports that she does not drink alcohol or use drugs. family history includes Diabetes in her brother and sister. Allergies  Allergen Reactions  . Amlodipine Itching    Scalp itching and hair loss  . Aspirin     Other reaction(s): Abdominal Pain  . Metformin     REACTION: gi upset even 1 x 500 mg  . Sertraline Hcl     REACTION: "couldn"t think"   Current Outpatient Medications on File Prior to Visit  Medication Sig Dispense Refill  . Accu-Chek FastClix Lancets MISC 2 (two) times daily. for testing    . atorvastatin (LIPITOR) 10 MG tablet Take 10 mg by mouth daily.    Marland Kitchen azelastine (ASTELIN) 0.1 % nasal spray Place into the nose.    . Cholecalciferol (D2000 ULTRA STRENGTH) 50 MCG (2000 UT) CAPS Take by  mouth.    . diphenhydramine-acetaminophen (TYLENOL PM) 25-500 MG TABS tablet Take 1 tablet by mouth at bedtime as needed.    Marland Kitchen glipiZIDE (GLUCOTROL) 5 MG tablet Take 2 tablet  by mouth daily before breakfast AND 3 tablets daily before supper. 270 tablet 3  . glucose blood (ACCU-CHEK AVIVA PLUS) test strip Use 3 (three) times daily E11.9 diagnosis code    . irbesartan (AVAPRO) 300 MG tablet USE AS DIRECTED DAILY 90 tablet 3  . JANUVIA 100 MG tablet Take 1 tablet (100 mg total) by mouth daily. 90 tablet 3  . levocetirizine (XYZAL) 5 MG tablet every evening.    . metFORMIN (GLUCOPHAGE-XR) 500 MG 24 hr tablet     . mirtazapine (REMERON) 7.5 MG tablet Take 1 tablet (7.5 mg total) by mouth at bedtime. 90 tablet 3  . Polyethyl Glycol-Propyl Glycol 0.4-0.3 % SOLN Apply to eye.    . traMADol (ULTRAM) 50 MG tablet Take 1 tablet (50 mg total) by mouth every 6 (six) hours as needed. 30 tablet 0   No current facility-administered medications on file prior to visit.   Review of Systems All otherwise neg per pt     Objective:   Physical Exam BP (!) 172/90 (BP Location: Left Arm, Patient Position: Sitting, Cuff Size: Normal)   Pulse 72   Temp 98.6 F (37 C) (  Oral)   Ht 5\' 3"  (1.6 m)   Wt 172 lb 2 oz (78.1 kg)   SpO2 98%   BMI 30.49 kg/m  VS noted,  Constitutional: Pt appears in NAD HENT: Head: NCAT.  Right Ear: External ear normal.  Left Ear: External ear normal.  Eyes: . Pupils are equal, round, and reactive to light. Conjunctivae and EOM are normal Nose: without d/c or deformity Neck: Neck supple. Gross normal ROM Cardiovascular: Normal rate and regular rhythm.   Pulmonary/Chest: Effort normal and breath sounds without rales or wheezing.  Abd:  Soft, NT, ND, + BS, no organomegaly Neurological: Pt is alert. At baseline orientation, motor grossly intact Skin: Skin is warm. No rashes, other new lesions, no LE edema Psychiatric: Pt behavior is normal without agitation  All otherwise neg per  pt Lab Results  Component Value Date   WBC 3.3 (L) 10/10/2006   HGB 12.5 10/10/2006   HCT 37.7 10/10/2006   PLT 121 (L) 10/10/2006   GLUCOSE 130 (H) 04/06/2019   CHOL 122 04/06/2019   TRIG 69.0 04/06/2019   HDL 69.10 04/06/2019   LDLCALC 39 04/06/2019   ALT 11 04/06/2019   AST 11 04/06/2019   NA 140 04/06/2019   K 3.7 04/06/2019   CL 103 04/06/2019   CREATININE 0.79 04/06/2019   BUN 13 04/06/2019   CO2 30 04/06/2019   TSH 1.09 08/11/2018   HGBA1C 8.3 (H) 04/06/2019   MICROALBUR <0.7 07/14/2018         Assessment & Plan:

## 2019-04-06 NOTE — Patient Instructions (Addendum)
You will be contacted regarding the referral for: mammogram and colonoscopy  Please take all new medication as prescribed - the metoprolol ER 50 mg per day (not the amlodipine as we realized you have not been able to take this in the past)  Please continue all other medications as before, and refills have been done if requested.  Please have the pharmacy call with any other refills you may need.  Please continue your efforts at being more active, low cholesterol diet, and weight control.  Please keep your appointments with your specialists as you may have planned  Please go to the LAB at the blood drawing area for the tests to be done  You will be contacted by phone if any changes need to be made immediately.  Otherwise, you will receive a letter about your results with an explanation, but please check with MyChart first.  Please remember to sign up for MyChart if you have not done so, as this will be important to you in the future with finding out test results, communicating by private email, and scheduling acute appointments online when needed.  Please make an Appointment to return in 6 months, or sooner if needed

## 2019-04-08 ENCOUNTER — Encounter: Payer: Self-pay | Admitting: Internal Medicine

## 2019-04-08 NOTE — Assessment & Plan Note (Addendum)
stable overall by history and exam, recent data reviewed with pt, and pt to continue medical treatment as before,  to f/u any worsening symptoms or concerns  I spent 32 minutes preparing to see the patient by review of recent labs, imaging and procedures, obtaining and reviewing separately obtained history, communicating with the patient and family or caregiver, ordering medications, tests or procedures, and documenting clinical information in the EHR including the differential Dx, treatment, and any further evaluation and other management of DM, HTN, UTI

## 2019-04-08 NOTE — Assessment & Plan Note (Signed)
Resolved,  to f/u any worsening symptoms or concerns  

## 2019-04-08 NOTE — Assessment & Plan Note (Signed)
stable overall by history and exam, recent data reviewed with pt, and pt to continue medical treatment as before,  to f/u any worsening symptoms or concerns  

## 2019-04-09 ENCOUNTER — Other Ambulatory Visit: Payer: Self-pay | Admitting: Internal Medicine

## 2019-04-09 MED ORDER — FARXIGA 10 MG PO TABS: 10.0000 mg | ORAL_TABLET | Freq: Every day | ORAL | 3 refills | Status: DC

## 2019-04-13 ENCOUNTER — Ambulatory Visit: Payer: Medicare Other | Admitting: Family Medicine

## 2019-04-16 ENCOUNTER — Other Ambulatory Visit: Payer: Self-pay

## 2019-04-18 ENCOUNTER — Telehealth: Payer: Self-pay | Admitting: Internal Medicine

## 2019-04-18 ENCOUNTER — Other Ambulatory Visit: Payer: Self-pay

## 2019-04-18 ENCOUNTER — Ambulatory Visit (INDEPENDENT_AMBULATORY_CARE_PROVIDER_SITE_OTHER): Payer: Medicare Other | Admitting: Internal Medicine

## 2019-04-18 ENCOUNTER — Encounter: Payer: Self-pay | Admitting: Internal Medicine

## 2019-04-18 VITALS — BP 146/72 | HR 67 | Temp 98.3°F | Ht 63.0 in | Wt 173.6 lb

## 2019-04-18 DIAGNOSIS — E1142 Type 2 diabetes mellitus with diabetic polyneuropathy: Secondary | ICD-10-CM | POA: Insufficient documentation

## 2019-04-18 DIAGNOSIS — E1165 Type 2 diabetes mellitus with hyperglycemia: Secondary | ICD-10-CM | POA: Diagnosis not present

## 2019-04-18 NOTE — Progress Notes (Signed)
Name: Ashley Merritt  Age/ Sex: 74 y.o., female   MRN/ DOB: [id number], 23-Jul-1945     PCP: Biagio Borg, MD   Reason for Endocrinology Evaluation: Type 2 Diabetes Mellitus  Initial Endocrine Consultative Visit: 07/14/2018    PATIENT IDENTIFIER: Ashley Merritt is a 74 y.o. female with a past medical history of HTN, GERD, T2DM and dyslipidemia. The patient has followed with Endocrinology clinic since 07/14/18 for consultative assistance with management of her diabetes.  DIABETIC HISTORY:  Ashley Merritt was diagnosed with T2DM in 1990 during routine workup. She was initially started on insulin but has been off for many years. She was on Metformin with GI side effects, SU, and Januvia. She went back to insulin briefly in 2014 due to hyperglycemia secondary to a back intra-articular injection. Her hemoglobin A1c has ranged from 7.1% in 2019, peaking at 8.1% in 2018.    Wilder Glade started by PCP 04/2019 SUBJECTIVE:   During the last visit (01/15/2019): A1 c 8.3 % Increased Glipizide, continued Metformin and januvia    Today (04/18/2019): [name] is here for a 3 month follow up on diabetes management. She checks her blood sugars 2x daily, preprandial to breakfast and dinner. The patient has not had hypoglycemic episodes since the last clinic visit. Otherwise, the patient has not required any recent emergency interventions for hypoglycemia and has not had recent hospitalizations secondary to hyper or hypoglycemic episodes.     ROS: As per HPI and as detailed below: Review of Systems  Eyes: Negative for blurred vision and pain.  Respiratory: Negative for cough and shortness of breath.   Cardiovascular: Negative for chest pain and palpitations.  Gastrointestinal: Negative for diarrhea and nausea.  Neurological: Negative for headaches.      HOME DIABETES REGIMEN:  Glipizide 5 mg, 2 tabs before breakfast breakfast and 3 tabs before supper Metformin 500 mg XR 1 tab BID  Januvia 100  mg daily      METER DOWNLOAD SUMMARY: Date range evaluated: 1/19-2/17/2021 Fingerstick Blood Glucose Tests = 21 Average Number Tests/Day = 1.5 Overall Mean FS Glucose = 159 Standard Deviation = 30.9  BG Ranges: Low = 109 High = 236   Hypoglycemic Events/30 Days: BG < 50 = 0 Episodes of symptomatic severe hypoglycemia = 0    HISTORY:  Past Medical History:  Past Medical History:  Diagnosis Date  . Diabetes mellitus without complication ([place])   . Hyperlipidemia   . Hypertension    Past Surgical History:  Past Surgical History:  Procedure Laterality Date  . SPINE SURGERY      Social History:  reports that she has never smoked. She has never used smokeless tobacco. She reports that she does not drink alcohol or use drugs. Family History:  Family History  Problem Relation Age of Onset  . Diabetes Sister   . Diabetes Brother      HOME MEDICATIONS: Allergies as of 04/18/2019      Reactions   Amlodipine Itching   Scalp itching and hair loss   Aspirin    Other reaction(s): Abdominal Pain   Metformin    REACTION: gi upset even 1 x 500 mg   Sertraline Hcl    REACTION: "couldn"t think"      Medication List       Accurate as of April 18, 2019  4:23 PM. If you have any questions, ask your nurse or doctor.        Accu-Chek Aviva Plus test strip Generic drug:  glucose blood Use 3 (three) times daily E11.9 diagnosis code   Accu-Chek FastClix Lancets Misc 2 (two) times daily. for testing   atorvastatin 10 MG tablet Commonly known as: LIPITOR Take 10 mg by mouth daily.   azelastine 0.1 % nasal spray Commonly known as: ASTELIN Place into the nose.   D2000 Ultra Strength 50 MCG (2000 UT) Caps Generic drug: Cholecalciferol Take by mouth.   diphenhydramine-acetaminophen 25-500 MG Tabs tablet Commonly known as: TYLENOL PM Take 1 tablet by mouth at bedtime as needed.   Farxiga 10 MG Tabs tablet Generic drug: dapagliflozin propanediol Take 10 mg by  mouth daily before breakfast.   glipiZIDE 5 MG tablet Commonly known as: GLUCOTROL Take 2 tablet  by mouth daily before breakfast AND 3 tablets daily before supper.   irbesartan 300 MG tablet Commonly known as: AVAPRO USE AS DIRECTED DAILY   Januvia 100 MG tablet Generic drug: sitaGLIPtin Take 1 tablet (100 mg total) by mouth daily.   levocetirizine 5 MG tablet Commonly known as: XYZAL every evening.   metFORMIN 500 MG 24 hr tablet Commonly known as: GLUCOPHAGE-XR   metoprolol succinate 50 MG 24 hr tablet Commonly known as: TOPROL-XL Take 1 tablet (50 mg total) by mouth daily. Take with or immediately following a meal.   mirtazapine 7.5 MG tablet Commonly known as: REMERON Take 1 tablet (7.5 mg total) by mouth at bedtime.   Polyethyl Glycol-Propyl Glycol 0.4-0.3 % Soln Apply to eye.   traMADol 50 MG tablet Commonly known as: ULTRAM Take 1 tablet (50 mg total) by mouth every 6 (six) hours as needed.        OBJECTIVE:   Vital Signs: BP (!) 146/72 (BP Location: Left Arm, Patient Position: Sitting, Cuff Size: Normal)   Pulse 67   Temp 98.3 F (36.8 C)   Ht 5\' 3"  (1.6 m)   Wt 173 lb 9.6 oz (78.7 kg)   SpO2 93%   BMI 30.75 kg/m   Wt Readings from Last 3 Encounters:  04/18/19 173 lb 9.6 oz (78.7 kg)  04/06/19 172 lb 2 oz (78.1 kg)  03/12/19 175 lb 3.2 oz (79.5 kg)     Exam: General: Pt appears well and is in NAD  Lungs: Clear with good BS bilat with no rales, rhonchi, or wheezes  Heart: RRR with normal S1 and S2 and no gallops; no murmurs; no rub  Abdomen: Normoactive bowel sounds, soft, nontender, without masses or organomegaly palpable  Extremities: No pretibial edema. No tremor.  Skin: Normal texture and temperature to palpation. No rash noted.  Neuro: MS is good with appropriate affect, pt is alert and Ox3       DM foot exam: 04/18/2019 The skin of the feet is intact without sores or ulcerations. The pedal pulses are 2+ on right and 2+ on left. The  sensation is decreased at the heels to a screening 5.07, 10 gram monofilament bilaterally         DATA REVIEWED:  Lab Results  Component Value Date   HGBA1C 8.3 (H) 04/06/2019   HGBA1C 8.6 (A) 01/15/2019   HGBA1C 8.3 (A) 10/13/2018   Lab Results  Component Value Date   MICROALBUR <0.7 07/14/2018   LDLCALC 39 04/06/2019   CREATININE 0.79 04/06/2019   Lab Results  Component Value Date   MICRALBCREAT 1.2 07/14/2018     Lab Results  Component Value Date   CHOL 122 04/06/2019   HDL 69.10 04/06/2019   LDLCALC 39 04/06/2019   TRIG 69.0  04/06/2019   CHOLHDL 2 04/06/2019         ASSESSMENT / PLAN / RECOMMENDATIONS:   1) Type 2 Diabetes Mellitus, Sub-Optimally controlled, With neuropathic complications - Most recent A1c of 8.3 %. Goal A1c < 7.0 %.    - Worsening glucose control, and review of her meter download she has variable BG readings ranging from 71-238 mg/dL at the same time of the day different dates, this is more consistent with dietary indiscretions. -We did discuss the importance of low carb snacks.  The patient snacks on sugar-free fruit cups and sugar-free cookies we did discuss that these options still contain carbohydrates. -She has been started on Farxiga by her PCP a couple weeks ago, she is tolerating this well, we did discuss the cardiovascular benefits as well as the risk of genital infections and dehydration.  I have emphasized proper hydration with [place].  MEDICATIONS:  Glipizide 5 mg, 2 tablets before breakfast and 3 tablets before Supper  Continue Metformin 500 mg XR 1 tablet BID  Januvia 100 mg daily   Farxiga 10 mg daily   EDUCATION / INSTRUCTIONS:  BG monitoring instructions: Patient is instructed to check her blood sugars 2 times a day, Before breakfast and supper time .  Call [facility] if: BG persistently < 70 or > 300. . I reviewed the Rule of 15 for the treatment of hypoglycemia in detail with the patient. Literature  supplied.   F/U in 3 months   Signed electronically by: Mack Guise, MD  [facility] Endocrinology  [facility] [address] [place], [place] [address] Phone: [phone] FAX: [phone]   CC: [address], [place] [address] Phone: [phone]  Fax: [phone]  Return to Endocrinology clinic as below: Future Appointments  Date Time Provider [place]  04/24/2019 11:30 AM Gregor Hams, MD [facility] None  07/17/2019 10:50 AM Rees Matura, Melanie Crazier, MD [facility] None  08/14/2019 10:20 AM Biagio Borg, MD [facility] None

## 2019-04-18 NOTE — Patient Instructions (Signed)
-   Glipizide 5 mg , 2 tablets before Breakfast and 3 tablets with Supper  - Continue metformin 500 mg Twice daily  - Continue Januvia 100 mg daily  - Continue Farxiga 10 mg daily with breakfast     HOW TO TREAT LOW BLOOD SUGARS (Blood sugar LESS THAN 70 MG/DL)  Please follow the RULE OF 15 for the treatment of hypoglycemia treatment (when your (blood sugars are less than 70 mg/dL)    STEP 1: Take 15 grams of carbohydrates when your blood sugar is low, which includes:   3-4 GLUCOSE TABS  OR  3-4 OZ OF JUICE OR REGULAR SODA OR  ONE TUBE OF GLUCOSE GEL     STEP 2: RECHECK blood sugar in 15 MINUTES STEP 3: If your blood sugar is still low at the 15 minute recheck --> then, go back to STEP 1 and treat AGAIN with another 15 grams of carbohydrates.

## 2019-04-18 NOTE — Telephone Encounter (Signed)
Faxed and confirmed last office visit note to [facility]

## 2019-04-18 NOTE — Telephone Encounter (Signed)
     Dolores Lory from [facility] requesting last ov note be faxed to her. Fax [phone] Phone [phone]

## 2019-04-19 ENCOUNTER — Ambulatory Visit: Payer: Medicare Other | Admitting: Family Medicine

## 2019-04-24 ENCOUNTER — Other Ambulatory Visit: Payer: Self-pay

## 2019-04-24 ENCOUNTER — Encounter: Payer: Self-pay | Admitting: Family Medicine

## 2019-04-24 ENCOUNTER — Ambulatory Visit (INDEPENDENT_AMBULATORY_CARE_PROVIDER_SITE_OTHER): Payer: Medicare Other | Admitting: Family Medicine

## 2019-04-24 VITALS — BP 140/64 | HR 60 | Ht 63.0 in | Wt 173.6 lb

## 2019-04-24 DIAGNOSIS — M5416 Radiculopathy, lumbar region: Secondary | ICD-10-CM

## 2019-04-24 DIAGNOSIS — M5441 Lumbago with sciatica, right side: Secondary | ICD-10-CM | POA: Diagnosis not present

## 2019-04-24 DIAGNOSIS — S39012A Strain of muscle, fascia and tendon of lower back, initial encounter: Secondary | ICD-10-CM | POA: Diagnosis not present

## 2019-04-24 DIAGNOSIS — M7061 Trochanteric bursitis, right hip: Secondary | ICD-10-CM | POA: Diagnosis not present

## 2019-04-24 DIAGNOSIS — M25551 Pain in right hip: Secondary | ICD-10-CM

## 2019-04-24 DIAGNOSIS — G8929 Other chronic pain: Secondary | ICD-10-CM | POA: Diagnosis not present

## 2019-04-24 NOTE — Progress Notes (Signed)
   I, Wendy Poet, LAT, ATC, am serving as scribe for Dr. Lynne Leader.  Ashley Merritt is a 74 y.o. female who presents to [place] at [facility] today for f/u of low back pain and radiating pain into B LEs, R>L.  She was last seen on 03/12/19 and was referred to home health PT.  She has been using Tramadol for pain.  Since her last visit, she reports that she is feeling better.  She states that she con't to have low back pain but notes decreased pain running into her B LEs.  She states that home health PT has been to her house and reports that today is her last visit w/ home health.  Diagnostic imaging: L-spine MRI- 03/05/19  Pertinent review of systems: No fever or chills.  Relevant historical information: HTN, DM   Exam:  BP 140/64 (BP Location: Left Arm, Patient Position: Sitting, Cuff Size: Normal)   Pulse 60   Ht 5\' 3"  (1.6 m)   Wt 173 lb 9.6 oz (78.7 kg)   SpO2 98%   BMI 30.75 kg/m  General: Well Developed, well nourished, and in no acute distress.   MSK: BL LE: Normal motion and strength. Normal gait.      Assessment and Plan: 74 y.o. female with improved lumbar pain, right lateral hip pain and right lumbar radicular pain.  Plan to finish home health PT.  Continue home exercise program.  Recheck with me as needed.  Recommend trying OTC voltaren gel for various MSK aches and pains.   Also provided information on COVID-19 vaccine.   Total encounter time 20 minutes including charting time date of service.   Discussed warning signs or symptoms. Please see discharge instructions. Patient expresses understanding.   The above documentation has been reviewed and is accurate and complete Lynne Leader

## 2019-04-24 NOTE — Patient Instructions (Signed)
Thank you for coming in today. Continue home exercises.  Recheck with me as needed.  Try using voltaren gel over the counter for knee pain.  OK to try using it other places.   COVID-19 Vaccine Information can be found at: ShippingScam.co.uk For questions related to vaccine distribution or appointments, please email [email or web address] or call [phone].

## 2019-05-01 ENCOUNTER — Other Ambulatory Visit: Payer: Self-pay | Admitting: Internal Medicine

## 2019-05-02 ENCOUNTER — Other Ambulatory Visit: Payer: Self-pay

## 2019-05-02 MED ORDER — ACCU-CHEK FASTCLIX LANCETS MISC: 1 refills | Status: DC

## 2019-05-03 ENCOUNTER — Ambulatory Visit: Payer: Medicare Other | Attending: Internal Medicine

## 2019-05-03 DIAGNOSIS — Z23 Encounter for immunization: Secondary | ICD-10-CM

## 2019-05-03 NOTE — Progress Notes (Signed)
   Covid-19 Vaccination Clinic  Name:  Ashley Merritt    MRN: [id number] DOB: 1945-12-04  05/03/2019  Ashley Merritt was observed post Covid-19 immunization for 15 minutes without incident. She was provided with Vaccine Information Sheet and instruction to access the V-Safe system.   Ashley Merritt was instructed to call 911 with any severe reactions post vaccine: Marland Kitchen Difficulty breathing  . Swelling of face and throat  . A fast heartbeat  . A bad rash all over body  . Dizziness and weakness   Immunizations Administered    Name Date Dose VIS Date Route   Pfizer COVID-19 Vaccine 05/03/2019  1:31 PM 0.3 mL 02/09/2019 Intramuscular   Manufacturer: [place]   Lot: [id number]   [place]: [id number]

## 2019-05-08 ENCOUNTER — Other Ambulatory Visit: Payer: Self-pay | Admitting: Internal Medicine

## 2019-05-22 ENCOUNTER — Other Ambulatory Visit: Payer: Self-pay | Admitting: Internal Medicine

## 2019-05-26 ENCOUNTER — Other Ambulatory Visit: Payer: Self-pay | Admitting: Internal Medicine

## 2019-05-29 ENCOUNTER — Ambulatory Visit: Payer: Medicare Other | Attending: Internal Medicine

## 2019-05-29 DIAGNOSIS — Z23 Encounter for immunization: Secondary | ICD-10-CM

## 2019-05-29 NOTE — Progress Notes (Signed)
   Covid-19 Vaccination Clinic  Name:  Ashley Merritt    MRN: [id number] DOB: April 22, 1945  05/29/2019  [name] was observed post Covid-19 immunization for 15 minutes without incident. She was provided with Vaccine Information Sheet and instruction to access the V-Safe system.   [name] was instructed to call 911 with any severe reactions post vaccine: Marland Kitchen Difficulty breathing  . Swelling of face and throat  . A fast heartbeat  . A bad rash all over body  . Dizziness and weakness   Immunizations Administered    Name Date Dose VIS Date Route   Pfizer COVID-19 Vaccine 05/29/2019  3:01 PM 0.3 mL 02/09/2019 Intramuscular   Manufacturer: [place]   Lot: [id number]   [place]: [id number]

## 2019-05-30 ENCOUNTER — Telehealth: Payer: Self-pay | Admitting: Gastroenterology

## 2019-05-30 ENCOUNTER — Encounter: Payer: Self-pay | Admitting: Gastroenterology

## 2019-05-30 NOTE — Telephone Encounter (Signed)
Left message for patient to call back and scheduled colonoscopy with Dr. Havery Moros.

## 2019-05-30 NOTE — Telephone Encounter (Signed)
Hey Dr. Havery Moros- this patient Ashley Merritt is being referred to [place] for a colonoscopy. She had her last colonoscopy at [facility] in 2017. She thinks she may be due but she is not sure. You are DOD from when we received this referral (2/11 PM). I am sending the records to you for review. Please advise once you get back into the office. Thank you!

## 2019-05-30 NOTE — Telephone Encounter (Signed)
I reviewed her last colonoscopy done at [facility]/2017 - she had 9 adenomas removed at that time, due for surveillance colonoscopy now. Okay to direct book with me at the [facility] if no prohibitive comorbidities

## 2019-06-10 ENCOUNTER — Other Ambulatory Visit: Payer: Self-pay | Admitting: Internal Medicine

## 2019-06-10 NOTE — Telephone Encounter (Signed)
Please refill as per office routine med refill policy (all routine meds refilled for 3 mo or monthly per pt preference up to one year from last visit, then month to month grace period for 3 mo, then further med refills will have to be denied)  

## 2019-06-21 DIAGNOSIS — D696 Thrombocytopenia, unspecified: Secondary | ICD-10-CM | POA: Insufficient documentation

## 2019-06-29 ENCOUNTER — Other Ambulatory Visit: Payer: Self-pay

## 2019-06-29 ENCOUNTER — Ambulatory Visit (AMBULATORY_SURGERY_CENTER): Payer: Self-pay | Admitting: *Deleted

## 2019-06-29 VITALS — Temp 96.9°F | Ht 63.0 in | Wt 172.0 lb

## 2019-06-29 DIAGNOSIS — Z86010 Personal history of colonic polyps: Secondary | ICD-10-CM

## 2019-06-29 MED ORDER — SUPREP BOWEL PREP KIT 17.5-3.13-1.6 GM/177ML PO SOLN: 1.0000 | Freq: Once | ORAL | 0 refills | Status: AC

## 2019-06-29 NOTE — Progress Notes (Signed)
Completed covid vaccines 05-29-19  No egg or soy allergy known to patient  No issues with past sedation with any surgeries  or procedures, no intubation problems  No diet pills per patient No home 02 use per patient  No blood thinners per patient  Pt denies issues with constipation - pt states in the last few weeks issues with constipation- she has drank Coffee and it has made her go- currently having soft Bm's daily to EOD  And soft, no straining but pt states she thinks she may need extra to make sure she is cleaned out- did a 2 day SUprep   No A fib or A flutter  EMMI video sent to pt's e mail   Son in Pv with Pt- temp 96.8  Due to the COVID-19 pandemic we are asking patients to follow these guidelines. Please only bring one care partner. Please be aware that your care partner may wait in the car in the parking lot or if they feel like they will be too hot to wait in the car, they may wait in the lobby on the 4th floor. All care partners are required to wear a mask the entire time (we do not have any that we can provide them), they need to practice social distancing, and we will do a Covid check for all patient's and care partners when you arrive. Also we will check their temperature and your temperature. If the care partner waits in their car they need to stay in the parking lot the entire time and we will call them on their cell phone when the patient is ready for discharge so they can bring the car to the front of the building. Also all patient's will need to wear a mask into building.

## 2019-07-02 ENCOUNTER — Other Ambulatory Visit: Payer: Self-pay | Admitting: Internal Medicine

## 2019-07-13 ENCOUNTER — Encounter: Payer: Self-pay | Admitting: Gastroenterology

## 2019-07-13 ENCOUNTER — Ambulatory Visit (AMBULATORY_SURGERY_CENTER): Payer: Medicare Other | Admitting: Gastroenterology

## 2019-07-13 ENCOUNTER — Other Ambulatory Visit: Payer: Self-pay

## 2019-07-13 VITALS — BP 144/72 | HR 61 | Temp 96.6°F | Resp 16 | Ht 63.0 in | Wt 172.0 lb

## 2019-07-13 DIAGNOSIS — D120 Benign neoplasm of cecum: Secondary | ICD-10-CM | POA: Diagnosis not present

## 2019-07-13 DIAGNOSIS — D123 Benign neoplasm of transverse colon: Secondary | ICD-10-CM

## 2019-07-13 DIAGNOSIS — D125 Benign neoplasm of sigmoid colon: Secondary | ICD-10-CM

## 2019-07-13 DIAGNOSIS — Z86010 Personal history of colonic polyps: Secondary | ICD-10-CM

## 2019-07-13 DIAGNOSIS — K635 Polyp of colon: Secondary | ICD-10-CM | POA: Diagnosis not present

## 2019-07-13 MED ORDER — SODIUM CHLORIDE 0.9 % IV SOLN: 500.0000 mL | Freq: Once | INTRAVENOUS | Status: DC

## 2019-07-13 NOTE — Progress Notes (Signed)
pt tolerated well. VSS. awake and to recovery. Report given to RN.  

## 2019-07-13 NOTE — Op Note (Addendum)
[place] Patient Name: Ashley Merritt Procedure Date: 07/13/2019 12:02 PM MRN: [id number] Endoscopist: Remo Lipps P. Ashley Merritt , MD Age: 74 Referring MD:  Date of Birth: [birth year] Gender: Female Account #: [id number] Procedure:                Colonoscopy Indications:              High risk colon cancer surveillance: Personal                            history of colonic polyps - 9 polyps removed at                            [facility] in 2017 Medicines:                Monitored Anesthesia Care Procedure:                Pre-Anesthesia Assessment:                           - Prior to the procedure, a History and Physical                            was performed, and patient medications and                            allergies were reviewed. The patient's tolerance of                            previous anesthesia was also reviewed. The risks                            and benefits of the procedure and the sedation                            options and risks were discussed with the patient.                            All questions were answered, and informed consent                            was obtained. Prior Anticoagulants: The patient has                            taken no previous anticoagulant or antiplatelet                            agents. ASA Grade Assessment: III - A patient with                            severe systemic disease. After reviewing the risks                            and benefits, the patient was deemed in  satisfactory condition to undergo the procedure.                           After obtaining informed consent, the colonoscope                            was passed under direct vision. Throughout the                            procedure, the patient's blood pressure, pulse, and                            oxygen saturations were monitored continuously. The                            Colonoscope was introduced through the anus  and                            advanced to the the cecum, identified by                            appendiceal orifice and ileocecal valve. The                            colonoscopy was performed without difficulty. The                            patient tolerated the procedure well. The quality                            of the bowel preparation was adequate. The                            ileocecal valve, appendiceal orifice, and rectum                            were photographed. Scope In: 12:07:58 PM Scope Out: 12:33:37 PM Scope Withdrawal Time: 0 hours 22 minutes 36 seconds  Total Procedure Duration: 0 hours 25 minutes 39 seconds  Findings:                 The perianal and digital rectal examinations were                            normal.                           Three flat polyps were found in the cecum. The                            polyps were 2 to 3 mm in size, one in the AO but                            not invading the appendix. One of these polyps was  removed with a cold snare, two removed with cold                            forceps. Resection and retrieval were complete.                           A 4 mm polyp was found in the hepatic flexure. The                            polyp was flat. The polyp was removed with a cold                            snare. Resection and retrieval were complete.                           Two sessile polyps were found in the transverse                            colon. The polyps were 3 to 4 mm in size. These                            polyps were removed with a cold snare. Resection                            and retrieval were complete.                           A 3 mm polyp was found in the sigmoid colon. The                            polyp was sessile. The polyp was removed with a                            cold snare. Resection and retrieval were complete.                           Multiple medium-mouthed  diverticula were found in                            the sigmoid colon and ascending colon.                           Internal hemorrhoids were found during                            retroflexion. The hemorrhoids were small.                           The exam was otherwise without abnormality. Complications:            No immediate complications. Estimated blood loss:  Minimal. Estimated Blood Loss:     Estimated blood loss was minimal. Impression:               - Three 2 to 3 mm polyps in the cecum, removed with                            a cold biopsy forceps / snare. Resected and                            retrieved.                           - One 4 mm polyp at the hepatic flexure, removed                            with a cold snare. Resected and retrieved.                           - Two 3 to 4 mm polyps in the transverse colon,                            removed with a cold snare. Resected and retrieved.                           - One 3 mm polyp in the sigmoid colon, removed with                            a cold snare. Resected and retrieved.                           - Diverticulosis in the sigmoid colon and in the                            ascending colon.                           - Internal hemorrhoids.                           - The examination was otherwise normal. Recommendation:           - Patient has a contact number available for                            emergencies. The signs and symptoms of potential                            delayed complications were discussed with the                            patient. Return to normal activities tomorrow.                            Written discharge instructions were provided to the  patient.                           - Resume previous diet.                           - Continue present medications.                           - Await pathology results. Remo Lipps P.  Ashley Uncapher, MD 07/13/2019 12:40:59 PM This report has been signed electronically.

## 2019-07-13 NOTE — Progress Notes (Signed)
Called to room to assist during endoscopic procedure.  Patient ID and intended procedure confirmed with present staff. Received instructions for my participation in the procedure from the performing physician.  

## 2019-07-13 NOTE — Progress Notes (Signed)
Vitals-CW Temp-JS  Pt's states no medical or surgical changes since previsit or office visit.

## 2019-07-13 NOTE — Patient Instructions (Signed)
Impression/Recommendations:  Polyp handout given to patient. Diverticulosis handout given to patient. Hemorrhoid handout given to patient.  Resume previous diet. Continue present medications. Await pathology results.  YOU HAD AN ENDOSCOPIC PROCEDURE TODAY AT THE [facility]:   Refer to the procedure report that was given to you for any specific questions about what was found during the examination.  If the procedure report does not answer your questions, please call your gastroenterologist to clarify.  If you requested that your care partner not be given the details of your procedure findings, then the procedure report has been included in a sealed envelope for you to review at your convenience later.  YOU SHOULD EXPECT: Some feelings of bloating in the abdomen. Passage of more gas than usual.  Walking can help get rid of the air that was put into your GI tract during the procedure and reduce the bloating. If you had a lower endoscopy (such as a colonoscopy or flexible sigmoidoscopy) you may notice spotting of blood in your stool or on the toilet paper. If you underwent a bowel prep for your procedure, you may not have a normal bowel movement for a few days.  Please Note:  You might notice some irritation and congestion in your nose or some drainage.  This is from the oxygen used during your procedure.  There is no need for concern and it should clear up in a day or so.  SYMPTOMS TO REPORT IMMEDIATELY:   Following lower endoscopy (colonoscopy or flexible sigmoidoscopy):  Excessive amounts of blood in the stool  Significant tenderness or worsening of abdominal pains  Swelling of the abdomen that is new, acute  Fever of 100F or higher For urgent or emergent issues, a gastroenterologist can be reached at any hour by calling [phone]. Do not use MyChart messaging for urgent concerns.    DIET:  We do recommend a small meal at first, but then you may proceed to your regular  diet.  Drink plenty of fluids but you should avoid alcoholic beverages for 24 hours.  ACTIVITY:  You should plan to take it easy for the rest of today and you should NOT DRIVE or use heavy machinery until tomorrow (because of the sedation medicines used during the test).    FOLLOW UP: Our staff will call the number listed on your records 48-72 hours following your procedure to check on you and address any questions or concerns that you may have regarding the information given to you following your procedure. If we do not reach you, we will leave a message.  We will attempt to reach you two times.  During this call, we will ask if you have developed any symptoms of COVID 19. If you develop any symptoms (ie: fever, flu-like symptoms, shortness of breath, cough etc.) before then, please call [phone].  If you test positive for Covid 19 in the 2 weeks post procedure, please call and report this information to us.    If any biopsies were taken you will be contacted by phone or by letter within the next 1-3 weeks.  Please call us at [phone] if you have not heard about the biopsies in 3 weeks.    SIGNATURES/CONFIDENTIALITY: You and/or your care partner have signed paperwork which will be entered into your electronic medical record.  These signatures attest to the fact that that the information above on your After Visit Summary has been reviewed and is understood.  Full responsibility of the confidentiality of this   discharge information lies with you and/or your care-partner. 

## 2019-07-16 NOTE — Progress Notes (Signed)
Name: Ashley Merritt  Age/ Sex: 74 y.o., female   MRN/ DOB: [id number], 1945/12/26     PCP: Biagio Borg, MD   Reason for Endocrinology Evaluation: Type 2 Diabetes Mellitus  Initial Endocrine Consultative Visit: 07/14/2018    PATIENT IDENTIFIER: Ashley Merritt is a 74 y.o. female with a past medical history of HTN, GERD, T2DM and dyslipidemia. The patient has followed with Endocrinology clinic since 07/14/18 for consultative assistance with management of her diabetes.  DIABETIC HISTORY:  Ashley Merritt was diagnosed with T2DM in 1990 during routine workup. She was initially started on insulin but has been off for many years. She was on Metformin with GI side effects, SU, and Januvia. She went back to insulin briefly in 2014 due to hyperglycemia secondary to a back intra-articular injection. Her hemoglobin A1c has ranged from 7.1% in 2019, peaking at 8.1% in 2018.    Wilder Glade started by PCP 04/2019 SUBJECTIVE:   During the last visit (04/18/2019): A1 c 8.3 % . Continued  Glipizide, Metformin and januvia    Today (07/17/2019): Ashley Merritt is here for a 3 month follow up on diabetes management. She didn't check her sugar for the past month. The patient has not had hypoglycemic episodes since the last clinic visit. Otherwise, the patient has not required any recent emergency interventions for hypoglycemia and has not had recent hospitalizations secondary to hyper or hypoglycemic episodes.     ROS: As per HPI and as detailed below: Review of Systems  Eyes: Negative for blurred vision and pain.  Respiratory: Negative for cough and shortness of breath.   Cardiovascular: Negative for chest pain and palpitations.  Gastrointestinal: Negative for diarrhea and nausea.  Neurological: Negative for headaches.      HOME DIABETES REGIMEN:  Glipizide 5 mg, 2 tabs before breakfast breakfast and 3 tabs before supper Metformin 500 mg XR 1 tab BID  Januvia 100 mg daily  Farxiga 10 mg daily       METER DOWNLOAD SUMMARY: had 1 reading    HISTORY:  Past Medical History:  Past Medical History:  Diagnosis Date  . Allergy   . Anemia    age 89 only   . Anxiety   . Arthritis   . Blood transfusion without reported diagnosis    with back surgery   . Cataract    bilat removed   . Diabetes mellitus without complication ([place])   . GERD (gastroesophageal reflux disease)    past hx - no meds   . Hyperlipidemia   . Hypertension    Past Surgical History:  Past Surgical History:  Procedure Laterality Date  . BUNIONECTOMY    . CATARACT EXTRACTION, BILATERAL    . COLONOSCOPY    . KNEE SURGERY    . POLYPECTOMY    . SPINE SURGERY      Social History:  reports that she has never smoked. She has never used smokeless tobacco. She reports that she does not drink alcohol or use drugs. Family History:  Family History  Problem Relation Age of Onset  . Diabetes Sister   . Diabetes Brother   . Colon cancer Neg Hx   . Colon polyps Neg Hx   . Esophageal cancer Neg Hx   . Rectal cancer Neg Hx   . Stomach cancer Neg Hx      HOME MEDICATIONS: Allergies as of 07/17/2019      Reactions   Amlodipine Itching   Scalp itching and hair loss  Aspirin    Other reaction(s): Abdominal Pain   Metformin    REACTION: gi upset even 1 x 500 mg   Sertraline Hcl    REACTION: "couldn"t think"      Medication List       Accurate as of Jul 17, 2019 11:07 AM. If you have any questions, ask your nurse or doctor.        Accu-Chek Aviva Plus test strip Generic drug: glucose blood USE TO TEST 3 TIMES A DAY   Accu-Chek FastClix Lancets Misc Use as directed to test blood sugar 3 times daily E11.65   atorvastatin 10 MG tablet Commonly known as: LIPITOR Take 10 mg by mouth daily.   azelastine 0.1 % nasal spray Commonly known as: ASTELIN PLACE 1- 2 SPRAYS INTO BOTH NOSTRILS 2 (TWO) TIMES DAILY   D2000 Ultra Strength 50 MCG (2000 UT) Caps Generic drug: Cholecalciferol Take by mouth.    diphenhydramine-acetaminophen 25-500 MG Tabs tablet Commonly known as: TYLENOL PM Take 1 tablet by mouth at bedtime as needed.   Farxiga 10 MG Tabs tablet Generic drug: dapagliflozin propanediol Take 10 mg by mouth daily before breakfast.   glipiZIDE 5 MG tablet Commonly known as: GLUCOTROL Take 2 tablet  by mouth daily before breakfast AND 3 tablets daily before supper.   irbesartan 300 MG tablet Commonly known as: AVAPRO USE AS DIRECTED DAILY   Januvia 100 MG tablet Generic drug: sitaGLIPtin TAKE 1 TABLET BY MOUTH EVERY DAY   levocetirizine 5 MG tablet Commonly known as: XYZAL every evening.   metFORMIN 500 MG 24 hr tablet Commonly known as: GLUCOPHAGE-XR   metoprolol succinate 50 MG 24 hr tablet Commonly known as: TOPROL-XL Take 1 tablet (50 mg total) by mouth daily. Take with or immediately following a meal.   mirtazapine 7.5 MG tablet Commonly known as: REMERON Take 1 tablet (7.5 mg total) by mouth at bedtime.   Polyethyl Glycol-Propyl Glycol 0.4-0.3 % Soln Apply to eye.   traMADol 50 MG tablet Commonly known as: ULTRAM Take 1 tablet (50 mg total) by mouth every 6 (six) hours as needed.        OBJECTIVE:   Vital Signs: BP 128/78 (BP Location: Left Arm, Patient Position: Sitting, Cuff Size: Large)   Pulse 68   Temp 98.7 F (37.1 C)   Ht 5\' 3"  (1.6 m)   Wt 172 lb (78 kg)   SpO2 98%   BMI 30.47 kg/m   Wt Readings from Last 3 Encounters:  07/17/19 172 lb (78 kg)  07/13/19 172 lb (78 kg)  06/29/19 172 lb (78 kg)     Exam: General: Pt appears well and is in NAD  Lungs: Clear with good BS bilat with no rales, rhonchi, or wheezes  Heart: RRR with normal S1 and S2 and no gallops; no murmurs; no rub  Abdomen: Normoactive bowel sounds, soft, nontender, without masses or organomegaly palpable  Extremities: No pretibial edema. No tremor.  Skin: Normal texture and temperature to palpation. No rash noted.  Neuro: MS is good with appropriate affect, pt is  alert and Ox3       DM foot exam: 04/18/2019 The skin of the feet is intact without sores or ulcerations. The pedal pulses are 2+ on right and 2+ on left. The sensation is decreased at the heels to a screening 5.07, 10 gram monofilament bilaterally         DATA REVIEWED:  Lab Results  Component Value Date   HGBA1C 8.3 (H) 04/06/2019  HGBA1C 8.6 (A) 01/15/2019   HGBA1C 8.3 (A) 10/13/2018   Lab Results  Component Value Date   MICROALBUR <0.7 07/14/2018   LDLCALC 39 04/06/2019   CREATININE 0.79 04/06/2019   Lab Results  Component Value Date   MICRALBCREAT 1.2 07/14/2018     Lab Results  Component Value Date   CHOL 122 04/06/2019   HDL 69.10 04/06/2019   LDLCALC 39 04/06/2019   TRIG 69.0 04/06/2019   CHOLHDL 2 04/06/2019         ASSESSMENT / PLAN / RECOMMENDATIONS:   1) Type 2 Diabetes Mellitus, Optimally controlled, With neuropathic complications - Most recent A1c of 7.0 %. Goal A1c < 7.0 %.    - A1c at goal today , but her in-office BG was 222 mg/dL , this is after eating oat meal with brown sugar and drinking cranberry juice. We discussed the importance of dietary discretions in diabetes care, she is already on 4 different classes of glycemic agents and her A1c is just at goal . I explained to her that if she does better with food we would potentially be able to reduce glycemic agents. - I have discussed switching Januvia to Rybelsus for better A1c reduction but she is not too keen on weight loss that comes with Rybelsus .  - No change today     MEDICATIONS:  Glipizide 5 mg, 2 tablets before breakfast and 3 tablets before Supper  Continue Metformin 500 mg XR 1 tablet BID  Januvia 100 mg daily   Farxiga 10 mg daily   EDUCATION / INSTRUCTIONS:  BG monitoring instructions: Patient is instructed to check her blood sugars 2 times a day, Before breakfast and supper time .  Call [facility] if: BG persistently < 70 or > 300. . I reviewed  the Rule of 15 for the treatment of hypoglycemia in detail with the patient. Literature supplied.   F/U in 6 months   Signed electronically by: Mack Guise, MD  [facility]  [facility] [address] [place], [place] [address] Phone: [phone] FAX: [phone]   CC: Biagio Borg, [place] [address] Phone: [phone]  Fax: [phone]  Return to Endocrinology clinic as below: Future Appointments  Date Time Provider [place]  08/14/2019 10:20 AM Biagio Borg, MD [facility] None

## 2019-07-17 ENCOUNTER — Telehealth: Payer: Self-pay | Admitting: *Deleted

## 2019-07-17 ENCOUNTER — Encounter: Payer: Self-pay | Admitting: Internal Medicine

## 2019-07-17 ENCOUNTER — Ambulatory Visit (INDEPENDENT_AMBULATORY_CARE_PROVIDER_SITE_OTHER): Payer: Medicare Other | Admitting: Internal Medicine

## 2019-07-17 ENCOUNTER — Other Ambulatory Visit: Payer: Self-pay

## 2019-07-17 VITALS — BP 128/78 | HR 68 | Temp 98.7°F | Ht 63.0 in | Wt 172.0 lb

## 2019-07-17 DIAGNOSIS — E1165 Type 2 diabetes mellitus with hyperglycemia: Secondary | ICD-10-CM

## 2019-07-17 LAB — POCT GLYCOSYLATED HEMOGLOBIN (HGB A1C): Hemoglobin A1C: 7 % — AB (ref 4.0–5.6)

## 2019-07-17 LAB — GLUCOSE, POCT (MANUAL RESULT ENTRY): POC Glucose: 222 mg/dl — AB (ref 70–99)

## 2019-07-17 MED ORDER — FARXIGA 10 MG PO TABS: 10.0000 mg | ORAL_TABLET | Freq: Every day | ORAL | 3 refills | Status: DC

## 2019-07-17 MED ORDER — METFORMIN HCL ER 500 MG PO TB24: 500.0000 mg | ORAL_TABLET | Freq: Two times a day (BID) | ORAL | 3 refills | Status: DC

## 2019-07-17 MED ORDER — GLIPIZIDE 5 MG PO TABS: ORAL_TABLET | ORAL | 3 refills | Status: DC

## 2019-07-17 MED ORDER — JANUVIA 100 MG PO TABS: 100.0000 mg | ORAL_TABLET | Freq: Every day | ORAL | 3 refills | Status: DC

## 2019-07-17 MED ORDER — ACCU-CHEK GUIDE VI STRP: ORAL_STRIP | 12 refills | Status: AC

## 2019-07-17 NOTE — Patient Instructions (Signed)
-   Glipizide 5 mg , 2 tablets before Breakfast and 3 tablets with Supper  - Continue metformin 500 mg Twice daily  - Continue Januvia 100 mg daily  - Continue Farxiga 10 mg daily with breakfast     HOW TO TREAT LOW BLOOD SUGARS (Blood sugar LESS THAN 70 MG/DL)  Please follow the RULE OF 15 for the treatment of hypoglycemia treatment (when your (blood sugars are less than 70 mg/dL)    STEP 1: Take 15 grams of carbohydrates when your blood sugar is low, which includes:   3-4 GLUCOSE TABS  OR  3-4 OZ OF JUICE OR REGULAR SODA OR  ONE TUBE OF GLUCOSE GEL     STEP 2: RECHECK blood sugar in 15 MINUTES STEP 3: If your blood sugar is still low at the 15 minute recheck --> then, go back to STEP 1 and treat AGAIN with another 15 grams of carbohydrates.

## 2019-07-17 NOTE — Telephone Encounter (Signed)
  Follow up Call-  Call back number 07/13/2019  Post procedure Call Back phone  # [phone]  Permission to leave phone message Yes  Some recent data might be hidden     Patient questions:  Do you have a fever, pain , or abdominal swelling? No. Pain Score  0 *  Have you tolerated food without any problems? Yes.    Have you been able to return to your normal activities? Yes.    Do you have any questions about your discharge instructions: Diet   No. Medications  No. Follow up visit  No.  Do you have questions or concerns about your Care? No.  Actions: * If pain score is 4 or above: No action needed, pain <4.  1. Have you developed a fever since your procedure? no  2.   Have you had an respiratory symptoms (SOB or cough) since your procedure? no  3.   Have you tested positive for COVID 19 since your procedure no  4.   Have you had any family members/close contacts diagnosed with the COVID 19 since your procedure?  no   If yes to any of these questions please route to Joylene John, RN and Erenest Rasher, RN

## 2019-07-19 ENCOUNTER — Telehealth: Payer: Self-pay | Admitting: Internal Medicine

## 2019-07-19 NOTE — Telephone Encounter (Signed)
Medication Refill Request  Did you call your pharmacy and request this refill first? Yes  . If patient has not contacted pharmacy first, instruct them to do so for future refills.  . Remind them that contacting the pharmacy for their refill is the quickest method to get the refill.  . Refill policy also stated that it will take anywhere between 24-72 hours to receive the refill.    Name of medication? Lancets (accu chek)  Is this a 90 day supply? Patient unsure  Name and location of pharmacy?  [id number] - [place], [place] Phone:  [phone]  Fax:  [phone]     ----> patient also states the test strips called in the other day need to be sent with a code that is for Medicare? Patient did not specify any further than that.

## 2019-07-20 ENCOUNTER — Other Ambulatory Visit: Payer: Self-pay

## 2019-07-20 MED ORDER — ACCU-CHEK SOFTCLIX LANCETS MISC
12 refills | Status: AC
Start: 2019-07-20 — End: ?

## 2019-07-20 NOTE — Telephone Encounter (Signed)
Spoke to [employer] at the pharmacy and was informed to send softclix lancets.

## 2019-07-26 ENCOUNTER — Other Ambulatory Visit: Payer: Self-pay | Admitting: Internal Medicine

## 2019-07-26 NOTE — Telephone Encounter (Signed)
Medical screening examination/treatment/procedure(s) were performed by non-physician practitioner and as supervising physician I was immediately available for consultation/collaboration. I agree with above. Nica Friske, MD   

## 2019-08-14 ENCOUNTER — Ambulatory Visit (INDEPENDENT_AMBULATORY_CARE_PROVIDER_SITE_OTHER): Payer: Medicare Other

## 2019-08-14 ENCOUNTER — Other Ambulatory Visit: Payer: Self-pay

## 2019-08-14 ENCOUNTER — Encounter: Payer: Self-pay | Admitting: Internal Medicine

## 2019-08-14 ENCOUNTER — Ambulatory Visit (INDEPENDENT_AMBULATORY_CARE_PROVIDER_SITE_OTHER): Payer: Medicare Other | Admitting: Internal Medicine

## 2019-08-14 VITALS — BP 140/68 | HR 66 | Temp 98.5°F | Ht 63.0 in | Wt 171.0 lb

## 2019-08-14 DIAGNOSIS — E119 Type 2 diabetes mellitus without complications: Secondary | ICD-10-CM

## 2019-08-14 DIAGNOSIS — I10 Essential (primary) hypertension: Secondary | ICD-10-CM | POA: Diagnosis not present

## 2019-08-14 DIAGNOSIS — E2839 Other primary ovarian failure: Secondary | ICD-10-CM

## 2019-08-14 DIAGNOSIS — R634 Abnormal weight loss: Secondary | ICD-10-CM

## 2019-08-14 DIAGNOSIS — Z794 Long term (current) use of insulin: Secondary | ICD-10-CM

## 2019-08-14 DIAGNOSIS — F418 Other specified anxiety disorders: Secondary | ICD-10-CM

## 2019-08-14 DIAGNOSIS — Z1231 Encounter for screening mammogram for malignant neoplasm of breast: Secondary | ICD-10-CM

## 2019-08-14 LAB — URINALYSIS, ROUTINE W REFLEX MICROSCOPIC
Bilirubin Urine: NEGATIVE
Hgb urine dipstick: NEGATIVE
Ketones, ur: NEGATIVE
Leukocytes,Ua: NEGATIVE
Nitrite: NEGATIVE
RBC / HPF: NONE SEEN (ref 0–?)
Specific Gravity, Urine: <=1.005 — AB (ref 1.000–1.030)
Total Protein, Urine: NEGATIVE
Urine Glucose: >=1000 — AB
Urobilinogen, UA: 0.2 (ref 0.0–1.0)
pH: 5.5 (ref 5.0–8.0)

## 2019-08-14 LAB — LIPID PANEL
Cholesterol: 122 mg/dL (ref 0–200)
HDL: 69.3 mg/dL (ref >39.00)
LDL Cholesterol: 41 mg/dL (ref 0–99)
NonHDL: 52.62
Total CHOL/HDL Ratio: 2
Triglycerides: 60 mg/dL (ref 0.0–149.0)
VLDL: 12 mg/dL (ref 0.0–40.0)

## 2019-08-14 LAB — BASIC METABOLIC PANEL
BUN: 20 mg/dL (ref 6–23)
CO2: 29 mEq/L (ref 19–32)
Calcium: 9.7 mg/dL (ref 8.4–10.5)
Chloride: 104 mEq/L (ref 96–112)
Creatinine, Ser: 0.98 mg/dL (ref 0.40–1.20)
GFR: 67.56 mL/min (ref >60.00)
Glucose, Bld: 114 mg/dL — ABNORMAL HIGH (ref 70–99)
Potassium: 4 mEq/L (ref 3.5–5.1)
Sodium: 140 mEq/L (ref 135–145)

## 2019-08-14 LAB — HEPATIC FUNCTION PANEL
ALT: 9 U/L (ref 0–35)
AST: 12 U/L (ref 0–37)
Albumin: 4.5 g/dL (ref 3.5–5.2)
Alkaline Phosphatase: 90 U/L (ref 39–117)
Bilirubin, Direct: 0.1 mg/dL (ref 0.0–0.3)
Total Bilirubin: 0.5 mg/dL (ref 0.2–1.2)
Total Protein: 7.2 g/dL (ref 6.0–8.3)

## 2019-08-14 LAB — MICROALBUMIN / CREATININE URINE RATIO
Creatinine,U: 32.9 mg/dL
Microalb Creat Ratio: 2.1 mg/g (ref 0.0–30.0)
Microalb, Ur: <0.7 mg/dL (ref 0.0–1.9)

## 2019-08-14 LAB — CBC WITH DIFFERENTIAL/PLATELET
Basophils Absolute: 0 10*3/uL (ref 0.0–0.1)
Basophils Relative: 0.9 % (ref 0.0–3.0)
Eosinophils Absolute: 0.1 10*3/uL (ref 0.0–0.7)
Eosinophils Relative: 1.8 % (ref 0.0–5.0)
HCT: 41.6 % (ref 36.0–46.0)
Hemoglobin: 13.7 g/dL (ref 12.0–15.0)
Lymphocytes Relative: 50.2 % — ABNORMAL HIGH (ref 12.0–46.0)
Lymphs Abs: 2.4 10*3/uL (ref 0.7–4.0)
MCHC: 33 g/dL (ref 30.0–36.0)
MCV: 65.4 fl — ABNORMAL LOW (ref 78.0–100.0)
Monocytes Absolute: 0.3 10*3/uL (ref 0.1–1.0)
Monocytes Relative: 6.9 % (ref 3.0–12.0)
Neutro Abs: 1.9 10*3/uL (ref 1.4–7.7)
Neutrophils Relative %: 40.2 % — ABNORMAL LOW (ref 43.0–77.0)
Platelets: 160 10*3/uL (ref 150.0–400.0)
RBC: 6.36 Mil/uL — ABNORMAL HIGH (ref 3.87–5.11)
RDW: 16.5 % — ABNORMAL HIGH (ref 11.5–15.5)
WBC: 4.8 10*3/uL (ref 4.0–10.5)

## 2019-08-14 LAB — TSH: TSH: 1.33 u[IU]/mL (ref 0.35–4.50)

## 2019-08-14 NOTE — Patient Instructions (Addendum)
Please schedule the bone density test before leaving today at the scheduling desk (where you check out)  You will be contacted regarding the referral for: mammogram  Please continue all other medications as before, and refills have been done if requested.  Please have the pharmacy call with any other refills you may need.  Please continue your efforts at being more active, low cholesterol diet, and weight control.  You are otherwise up to date with prevention measures today.  Please keep your appointments with your specialists as you may have planned  Please go to the XRAY Department in the first floor for the x-ray testing  Please go to the LAB at the blood drawing area for the tests to be done  You will be contacted by phone if any changes need to be made immediately.  Otherwise, you will receive a letter about your results with an explanation, but please check with MyChart first.  Please remember to sign up for MyChart if you have not done so, as this will be important to you in the future with finding out test results, communicating by private email, and scheduling acute appointments online when needed.  Please make an Appointment to return in 6 months, or sooner if needed

## 2019-08-14 NOTE — Progress Notes (Signed)
Subjective:    Patient ID: Ashley Merritt, female    DOB: January 18, 1946, 74 y.o.   MRN: [id number]  HPI  Here to f/u; overall doing ok,  Pt denies chest pain, increasing sob or doe, wheezing, orthopnea, PND, increased LE swelling, palpitations, dizziness or syncope.  Pt denies new neurological symptoms such as new headache, or facial or extremity weakness or numbness.  Pt denies polydipsia, polyuria, or low sugar episode.  Pt states overall good compliance with meds, mostly trying to follow appropriate diet, with wt overall stable,  but little exercise however. Plans to call Dr Katy Fitch for yearly eye exam soon. Wt loss 10 lbs without reason - Denies worsening depressive symptoms, suicidal ideation, or panic Past Medical History:  Diagnosis Date  . Allergy   . Anemia    age 45 only   . Anxiety   . Arthritis   . Blood transfusion without reported diagnosis    with back surgery   . Cataract    bilat removed   . Diabetes mellitus without complication ([place])   . GERD (gastroesophageal reflux disease)    past hx - no meds   . Hyperlipidemia   . Hypertension    Past Surgical History:  Procedure Laterality Date  . BUNIONECTOMY    . CATARACT EXTRACTION, BILATERAL    . COLONOSCOPY    . KNEE SURGERY    . POLYPECTOMY    . SPINE SURGERY      reports that she has never smoked. She has never used smokeless tobacco. She reports that she does not drink alcohol and does not use drugs. family history includes Diabetes in her brother and sister. Allergies  Allergen Reactions  . Amlodipine Itching    Scalp itching and hair loss  . Aspirin     Other reaction(s): Abdominal Pain  . Metformin     REACTION: gi upset even 1 x 500 mg  . Sertraline Hcl     REACTION: "couldn"t think"   Current Outpatient Medications on File Prior to Visit  Medication Sig Dispense Refill  . Accu-Chek Softclix Lancets lancets Use as instructed to test blood sugar 3 times daily E11.65 100 each 12  . atorvastatin (LIPITOR)  10 MG tablet Take 10 mg by mouth daily.    Marland Kitchen azelastine (ASTELIN) 0.1 % nasal spray PLACE 1- 2 SPRAYS INTO BOTH NOSTRILS 2 (TWO) TIMES DAILY 30 mL 1  . Cholecalciferol (D2000 ULTRA STRENGTH) 50 MCG (2000 UT) CAPS Take by mouth.    . dapagliflozin propanediol (FARXIGA) 10 MG TABS tablet Take 10 mg by mouth daily before breakfast. 90 tablet 3  . diphenhydramine-acetaminophen (TYLENOL PM) 25-500 MG TABS tablet Take 1 tablet by mouth at bedtime as needed.    Marland Kitchen glipiZIDE (GLUCOTROL) 5 MG tablet Take 2 tablets (10 mg total) by mouth daily before breakfast AND 3 tablets (15 mg total) daily before supper. Take 2 tablet  by mouth daily before breakfast AND 3 tablets daily before supper.. 450 tablet 3  . glucose blood (ACCU-CHEK GUIDE) test strip Use as instructed 100 each 12  . irbesartan (AVAPRO) 300 MG tablet USE AS DIRECTED DAILY 90 tablet 3  . JANUVIA 100 MG tablet Take 1 tablet (100 mg total) by mouth daily. 90 tablet 3  . metFORMIN (GLUCOPHAGE-XR) 500 MG 24 hr tablet Take 1 tablet (500 mg total) by mouth 2 (two) times daily with a meal. 180 tablet 3  . metoprolol succinate (TOPROL-XL) 50 MG 24 hr tablet Take 1 tablet (50  mg total) by mouth daily. Take with or immediately following a meal. 90 tablet 3  . mirtazapine (REMERON) 7.5 MG tablet TAKE 1 TABLET BY MOUTH EVERYDAY AT BEDTIME 90 tablet 3  . Polyethyl Glycol-Propyl Glycol 0.4-0.3 % SOLN Apply to eye.    . traMADol (ULTRAM) 50 MG tablet Take 1 tablet (50 mg total) by mouth every 6 (six) hours as needed. 30 tablet 0   No current facility-administered medications on file prior to visit.   Review of Systems All otherwise neg per pt    Objective:   Physical Exam BP 140/68 (BP Location: Left Arm, Patient Position: Sitting, Cuff Size: Large)   Pulse 66   Temp 98.5 F (36.9 C) (Oral)   Ht 5\' 3"  (1.6 m)   Wt 171 lb (77.6 kg)   SpO2 97%   BMI 30.29 kg/m  VS noted,  Constitutional: Pt appears in NAD HENT: Head: NCAT.  Right Ear: External ear  normal.  Left Ear: External ear normal.  Eyes: . Pupils are equal, round, and reactive to light. Conjunctivae and EOM are normal Nose: without d/c or deformity Neck: Neck supple. Gross normal ROM Cardiovascular: Normal rate and regular rhythm.   Pulmonary/Chest: Effort normal and breath sounds without rales or wheezing.  Abd:  Soft, NT, ND, + BS, no organomegaly Neurological: Pt is alert. At baseline orientation, motor grossly intact Skin: Skin is warm. No rashes, other new lesions, no LE edema Psychiatric: Pt behavior is normal without agitation  Lab Results  Component Value Date   WBC 4.8 08/14/2019   HGB 13.7 08/14/2019   HCT 41.6 08/14/2019   PLT 160.0 08/14/2019   GLUCOSE 114 (H) 08/14/2019   CHOL 122 08/14/2019   TRIG 60.0 08/14/2019   HDL 69.30 08/14/2019   LDLCALC 41 08/14/2019   ALT 9 08/14/2019   AST 12 08/14/2019   NA 140 08/14/2019   K 4.0 08/14/2019   CL 104 08/14/2019   CREATININE 0.98 08/14/2019   BUN 20 08/14/2019   CO2 29 08/14/2019   TSH 1.33 08/14/2019   HGBA1C 7.0 (A) 07/17/2019   MICROALBUR <0.7 08/14/2019       Assessment & Plan:

## 2019-08-15 ENCOUNTER — Encounter: Payer: Self-pay | Admitting: Internal Medicine

## 2019-08-15 NOTE — Assessment & Plan Note (Signed)
F/u per endo, stable

## 2019-08-15 NOTE — Assessment & Plan Note (Signed)
stable overall by history and exam, recent data reviewed with pt, and pt to continue medical treatment as before,  to f/u any worsening symptoms or concerns  

## 2019-08-15 NOTE — Assessment & Plan Note (Addendum)
Etiology unclear, for cxr, labs  I spent 31 minutes in preparing to see the patient by review of recent labs, imaging and procedures, obtaining and reviewing separately obtained history, communicating with the patient and family or caregiver, ordering medications, tests or procedures, and documenting clinical information in the EHR including the differential Dx, treatment, and any further evaluation and other management of wt loss, dm, htn, depression

## 2019-08-21 ENCOUNTER — Other Ambulatory Visit: Payer: Self-pay

## 2019-08-21 ENCOUNTER — Ambulatory Visit (INDEPENDENT_AMBULATORY_CARE_PROVIDER_SITE_OTHER)
Admission: RE | Admit: 2019-08-21 | Discharge: 2019-08-21 | Disposition: A | Discharge status: Home / Self Care | Payer: Medicare Other | Source: Ambulatory Visit | Attending: Internal Medicine | Admitting: Internal Medicine

## 2019-08-21 DIAGNOSIS — E2839 Other primary ovarian failure: Secondary | ICD-10-CM | POA: Diagnosis not present

## 2019-08-31 ENCOUNTER — Ambulatory Visit: Payer: Medicare Other

## 2019-09-14 ENCOUNTER — Ambulatory Visit: Payer: Medicare Other

## 2019-10-12 ENCOUNTER — Ambulatory Visit
Admission: RE | Admit: 2019-10-12 | Discharge: 2019-10-12 | Disposition: A | Discharge status: Home / Self Care | Payer: 59 | Source: Ambulatory Visit | Attending: Internal Medicine | Admitting: Internal Medicine

## 2019-10-12 ENCOUNTER — Other Ambulatory Visit: Payer: Self-pay

## 2019-10-12 DIAGNOSIS — Z1231 Encounter for screening mammogram for malignant neoplasm of breast: Secondary | ICD-10-CM

## 2019-11-08 ENCOUNTER — Telehealth: Payer: Self-pay | Admitting: Hematology

## 2019-11-08 NOTE — Telephone Encounter (Signed)
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 Ashley Merritt has been cld and scheduled to see Dr. Irene Limbo on 10/7 at 11am. Pt aware to arrive 20 minutes early.

## 2019-11-12 ENCOUNTER — Telehealth: Payer: Self-pay | Admitting: Hematology

## 2019-11-12 NOTE — Telephone Encounter (Signed)
Turned Ms. Bodner's callt o r/s her new hem appt to 10/14 at 11am. Pt aware to arrive 15 minutes early.

## 2019-11-16 ENCOUNTER — Other Ambulatory Visit: Payer: Self-pay

## 2019-11-16 ENCOUNTER — Ambulatory Visit (INDEPENDENT_AMBULATORY_CARE_PROVIDER_SITE_OTHER): Payer: 59 | Admitting: Podiatry

## 2019-11-16 ENCOUNTER — Encounter: Payer: Self-pay | Admitting: Podiatry

## 2019-11-16 DIAGNOSIS — M79675 Pain in left toe(s): Secondary | ICD-10-CM | POA: Diagnosis not present

## 2019-11-16 DIAGNOSIS — B351 Tinea unguium: Secondary | ICD-10-CM | POA: Diagnosis not present

## 2019-11-16 DIAGNOSIS — M79674 Pain in right toe(s): Secondary | ICD-10-CM

## 2019-11-16 DIAGNOSIS — E119 Type 2 diabetes mellitus without complications: Secondary | ICD-10-CM | POA: Diagnosis not present

## 2019-11-16 NOTE — Progress Notes (Signed)
This patient returns to my office for at risk foot care.  This patient requires this care by a professional since this patient will be at risk due to having type 2 diabetes and thrombocytopenia.   This patient is unable to cut nails herself since the patient cannot reach her nails.These nails are painful walking and wearing shoes.  This patient presents for at risk foot care today.  General Appearance  Alert, conversant and in no acute stress.  Vascular  Dorsalis pedis and posterior tibial  pulses are weakly  palpable  bilaterally.  Capillary return is within normal limits  bilaterally. Temperature is within normal limits  bilaterally.  Neurologic  Senn-Weinstein monofilament wire test within normal limits  bilaterally. Muscle power within normal limits bilaterally.  Nails Thick disfigured discolored nails with subungual debris  from hallux to fifth toes bilaterally. No evidence of bacterial infection or drainage bilaterally.  Orthopedic  No limitations of motion  feet .  No crepitus or effusions noted.  No bony pathology or digital deformities noted.  Skin  normotropic skin with no porokeratosis noted bilaterally.  No signs of infections or ulcers noted.   Scar over right big toe.  Onychomycosis  Pain in right toes  Pain in left toes  Consent was obtained for treatment procedures.   Mechanical debridement of nails 1-5  bilaterally performed with a nail nipper.  Filed with dremel without incident.    Return office visit   10 weeks                   Told patient to return for periodic foot care and evaluation due to potential at risk complications.   Andre Swander DPM  

## 2019-12-06 ENCOUNTER — Encounter: Payer: 59 | Admitting: Hematology

## 2019-12-13 ENCOUNTER — Other Ambulatory Visit: Payer: Self-pay

## 2019-12-13 ENCOUNTER — Telehealth: Payer: Self-pay | Admitting: Hematology

## 2019-12-13 ENCOUNTER — Inpatient Hospital Stay: Payer: 59 | Attending: Hematology | Admitting: Hematology

## 2019-12-13 ENCOUNTER — Inpatient Hospital Stay: Payer: 59

## 2019-12-13 VITALS — BP 160/76 | HR 70 | Temp 97.0°F | Resp 18 | Ht 63.0 in | Wt 176.3 lb

## 2019-12-13 DIAGNOSIS — R109 Unspecified abdominal pain: Secondary | ICD-10-CM | POA: Diagnosis not present

## 2019-12-13 DIAGNOSIS — I10 Essential (primary) hypertension: Secondary | ICD-10-CM | POA: Insufficient documentation

## 2019-12-13 DIAGNOSIS — D582 Other hemoglobinopathies: Secondary | ICD-10-CM | POA: Diagnosis not present

## 2019-12-13 DIAGNOSIS — M129 Arthropathy, unspecified: Secondary | ICD-10-CM | POA: Insufficient documentation

## 2019-12-13 DIAGNOSIS — D561 Beta thalassemia: Secondary | ICD-10-CM | POA: Insufficient documentation

## 2019-12-13 DIAGNOSIS — G8929 Other chronic pain: Secondary | ICD-10-CM | POA: Insufficient documentation

## 2019-12-13 DIAGNOSIS — D649 Anemia, unspecified: Secondary | ICD-10-CM | POA: Insufficient documentation

## 2019-12-13 DIAGNOSIS — R5383 Other fatigue: Secondary | ICD-10-CM | POA: Diagnosis not present

## 2019-12-13 DIAGNOSIS — E785 Hyperlipidemia, unspecified: Secondary | ICD-10-CM | POA: Insufficient documentation

## 2019-12-13 DIAGNOSIS — D696 Thrombocytopenia, unspecified: Secondary | ICD-10-CM | POA: Insufficient documentation

## 2019-12-13 DIAGNOSIS — R718 Other abnormality of red blood cells: Secondary | ICD-10-CM

## 2019-12-13 DIAGNOSIS — M7989 Other specified soft tissue disorders: Secondary | ICD-10-CM | POA: Insufficient documentation

## 2019-12-13 DIAGNOSIS — E1136 Type 2 diabetes mellitus with diabetic cataract: Secondary | ICD-10-CM | POA: Insufficient documentation

## 2019-12-13 DIAGNOSIS — M549 Dorsalgia, unspecified: Secondary | ICD-10-CM | POA: Insufficient documentation

## 2019-12-13 DIAGNOSIS — K219 Gastro-esophageal reflux disease without esophagitis: Secondary | ICD-10-CM | POA: Diagnosis not present

## 2019-12-13 LAB — CBC WITH DIFFERENTIAL/PLATELET
Abs Immature Granulocytes: 0.01 10*3/uL (ref 0.00–0.07)
Basophils Absolute: 0 10*3/uL (ref 0.0–0.1)
Basophils Relative: 1 %
Eosinophils Absolute: 0.1 10*3/uL (ref 0.0–0.5)
Eosinophils Relative: 1 %
HCT: 40.3 % (ref 36.0–46.0)
Hemoglobin: 13.1 g/dL (ref 12.0–15.0)
Immature Granulocytes: 0 %
Lymphocytes Relative: 36 %
Lymphs Abs: 1.6 10*3/uL (ref 0.7–4.0)
MCH: 21.1 pg — ABNORMAL LOW (ref 26.0–34.0)
MCHC: 32.5 g/dL (ref 30.0–36.0)
MCV: 64.8 fL — ABNORMAL LOW (ref 80.0–100.0)
Monocytes Absolute: 0.3 10*3/uL (ref 0.1–1.0)
Monocytes Relative: 6 %
Neutro Abs: 2.4 10*3/uL (ref 1.7–7.7)
Neutrophils Relative %: 56 %
Platelets: 119 10*3/uL — ABNORMAL LOW (ref 150–400)
RBC: 6.22 MIL/uL — ABNORMAL HIGH (ref 3.87–5.11)
RDW: 14.9 % (ref 11.5–15.5)
WBC: 4.3 10*3/uL (ref 4.0–10.5)
nRBC: 0 % (ref 0.0–0.2)

## 2019-12-13 LAB — CMP (CANCER CENTER ONLY)
ALT: 10 U/L (ref 0–44)
AST: 12 U/L — ABNORMAL LOW (ref 15–41)
Albumin: 3.9 g/dL (ref 3.5–5.0)
Alkaline Phosphatase: 163 U/L — ABNORMAL HIGH (ref 38–126)
Anion gap: 3 — ABNORMAL LOW (ref 5–15)
BUN: 18 mg/dL (ref 8–23)
CO2: 34 mmol/L — ABNORMAL HIGH (ref 22–32)
Calcium: 9.9 mg/dL (ref 8.9–10.3)
Chloride: 100 mmol/L (ref 98–111)
Creatinine: 1.26 mg/dL — ABNORMAL HIGH (ref 0.44–1.00)
GFR, Estimated: 43 mL/min — ABNORMAL LOW (ref >60)
Glucose, Bld: 324 mg/dL — ABNORMAL HIGH (ref 70–99)
Potassium: 3.9 mmol/L (ref 3.5–5.1)
Sodium: 137 mmol/L (ref 135–145)
Total Bilirubin: 0.6 mg/dL (ref 0.3–1.2)
Total Protein: 7.1 g/dL (ref 6.5–8.1)

## 2019-12-13 LAB — IRON AND TIBC
Iron: 70 ug/dL (ref 41–142)
Saturation Ratios: 25 % (ref 21–57)
TIBC: 283 ug/dL (ref 236–444)
UIBC: 212 ug/dL (ref 120–384)

## 2019-12-13 LAB — RETICULOCYTES
Immature Retic Fract: 6.2 % (ref 2.3–15.9)
RBC.: 6.12 MIL/uL — ABNORMAL HIGH (ref 3.87–5.11)
Retic Count, Absolute: 44.1 10*3/uL (ref 19.0–186.0)
Retic Ct Pct: 0.7 % (ref 0.4–3.1)

## 2019-12-13 LAB — FERRITIN: Ferritin: 272 ng/mL (ref 11–307)

## 2019-12-13 LAB — IMMATURE PLATELET FRACTION: Immature Platelet Fraction: 11.8 % — ABNORMAL HIGH (ref 1.2–8.6)

## 2019-12-13 LAB — LACTATE DEHYDROGENASE: LDH: 146 U/L (ref 98–192)

## 2019-12-13 LAB — VITAMIN B12: Vitamin B-12: 882 pg/mL (ref 180–914)

## 2019-12-13 NOTE — Patient Instructions (Signed)
Thank you for choosing [facility] to provide your oncology and hematology care.   Should you have questions after your visit to the [facility] ([facility]), please contact this office at [phone] between 8:30 AM and 4:30 PM.  Voice mails left after 4:00 PM may not be returned until the following business day.  Calls received after 4:30 PM will be answered by an off-site Nurse Triage Line.    Prescription Refills:  Please have your pharmacy contact us directly for most prescription requests.  Contact the office directly for refills of narcotics (pain medications). Allow 48-72 hours for refills.  Appointments: Please contact the [facility] scheduling department [phone] for questions regarding [facility] appointment scheduling.  Contact the schedulers with any scheduling changes so that your appointment can be rescheduled in a timely manner.   Central Scheduling for [facility] [phone] - Call to schedule procedures such as PET scans, CT scans, MRI, Ultrasound, etc.  To afford each patient quality time with our providers, please arrive 30 minutes before your scheduled appointment time.  If you arrive late for your appointment, you may be asked to reschedule.  We strive to give you quality time with our providers, and arriving late affects you and other patients whose appointments are after yours. If you are a no show for multiple scheduled visits, you may be dismissed from the clinic at the providers discretion.     Resources: [facility] Social Workers [phone] for additional information on assistance programs or assistance connecting with community support programs   [place] DSS  [phone]: Information regarding food stamps, Medicaid, and utility assistance [place] Access [place] [phone]   [place] [employer]'s shared-ride transportation service for eligible riders who have a disability that prevents them from riding the fixed route bus.   Medicare  Rights Center 800-333-4114 Helps people with Medicare understand their rights and benefits, navigate the Medicare system, and secure the quality healthcare they deserve [employer] 800-227-2345 Assists patients locate various types of support and financial assistance Cancer Care: 1-800-813-HOPE (4673) Provides financial assistance, online support groups, medication/co-pay assistance.   Transportation Assistance for appointments at [facility]: Transportation Coordinator [phone]  Again, thank you for choosing [facility] for your care.       

## 2019-12-13 NOTE — Progress Notes (Signed)
HEMATOLOGY/ONCOLOGY CONSULTATION NOTE  Date of Service: 12/13/2019  Patient Care Team: Biagio Borg, MD as PCP - General (Internal Medicine)  CHIEF COMPLAINTS/PURPOSE OF CONSULTATION:  Beta Thalassemia ?  HISTORY OF PRESENTING ILLNESS:  Ashley Merritt is a wonderful 74 y.o. 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 Pt is accompanied today by [name]. The pt reports that she is doing well overall.   The pt reports that she saw a hematologist at [facility] in the early 2000's. They believed that there was nothing to be done about her lab abnormalities. Her chronic knee and back pain are thought to be caused by Arthritis. Pt is taking a daily Vitamin D. Her Diabetes has not been well-controlled. She was previously on Metformin, but is currently trying new medications with her PCP.   Most recent lab results (11/07/2019) of CBC w/diff & CMP is as follows: all values are WNL except for RBC at 6.42, MCV at 69.5, MCH at 21.5, MCHC at 30.9, RDW at 16.7, PLT at 109K, Glucose at 193.  On review of systems, pt reports chronic knee, chronic back pain, fatigue and denies abdominal pain, leg swelling, abnormal/excessive bleeding and any other symptoms.   On PMHx the pt reports Anemia, Arthritis, Diabetes, HLD, HTN, GERD, Spine surgery, Knee surgery. On Social Hx the pt reports that she is a non-smoker.  On Family Hx the pt reports that her daughter has Hgb C trait, 1-2 of her other children have Sickle Cell trait.   MEDICAL HISTORY:  Past Medical History:  Diagnosis Date  . Allergy   . Anemia    age 64 only   . Anxiety   . Arthritis   . Blood transfusion without reported diagnosis    with back surgery   . Cataract    bilat removed   . Diabetes mellitus without complication ([place])   . GERD (gastroesophageal reflux disease)    past hx - no meds   . Hyperlipidemia   . Hypertension     SURGICAL HISTORY: Past Surgical History:    Procedure Laterality Date  . BUNIONECTOMY    . CATARACT EXTRACTION, BILATERAL    . COLONOSCOPY    . KNEE SURGERY    . POLYPECTOMY    . SPINE SURGERY      SOCIAL HISTORY: Social History   Socioeconomic History  . Marital status: Divorced    Spouse name: Not on file  . Number of children: Not on file  . Years of education: Not on file  . Highest education level: Not on file  Occupational History  . Not on file  Tobacco Use  . Smoking status: Never Smoker  . Smokeless tobacco: Never Used  Vaping Use  . Vaping Use: Never used  Substance and Sexual Activity  . Alcohol use: Never  . Drug use: Never  . Sexual activity: Not Currently  Other Topics Concern  . Not on file  Social History Narrative  . Not on file   Social Determinants of Health   Financial Resource Strain:   . Difficulty of Paying Living Expenses: Not on file  Food Insecurity:   . Worried About Charity fundraiser in the Last Year: Not on file  . Ran Out of Food in the Last Year: Not on file  Transportation Needs:   . Lack of Transportation (Medical): Not on file  . Lack of Transportation (Non-Medical): Not on file  Physical Activity:   .  Days of Exercise per Week: Not on file  . Minutes of Exercise per Session: Not on file  Stress:   . Feeling of Stress : Not on file  Social Connections:   . Frequency of Communication with Friends and Family: Not on file  . Frequency of Social Gatherings with Friends and Family: Not on file  . Attends Religious Services: Not on file  . Active Member of Clubs or Organizations: Not on file  . Attends Archivist Meetings: Not on file  . Marital Status: Not on file  Intimate Partner Violence:   . Fear of Current or Ex-Partner: Not on file  . Emotionally Abused: Not on file  . Physically Abused: Not on file  . Sexually Abused: Not on file    FAMILY HISTORY: Family History  Adopted: Yes  Problem Relation Age of Onset  . Diabetes Sister   . Diabetes  Brother   . Colon cancer Neg Hx   . Colon polyps Neg Hx   . Esophageal cancer Neg Hx   . Rectal cancer Neg Hx   . Stomach cancer Neg Hx     ALLERGIES:  is allergic to amlodipine, aspirin, metformin, and sertraline hcl.  MEDICATIONS:  Current Outpatient Medications  Medication Sig Dispense Refill  . Accu-Chek Softclix Lancets lancets Use as instructed to test blood sugar 3 times daily E11.65 100 each 12  . atorvastatin (LIPITOR) 10 MG tablet Take 10 mg by mouth daily.    . cetirizine (ZYRTEC) 10 MG tablet Take 10 mg by mouth daily.    . Cholecalciferol (D2000 ULTRA STRENGTH) 50 MCG (2000 UT) CAPS Take by mouth.    . diphenhydramine-acetaminophen (TYLENOL PM) 25-500 MG TABS tablet Take 1 tablet by mouth at bedtime as needed.    Marland Kitchen glipiZIDE (GLUCOTROL) 5 MG tablet Take 2 tablets (10 mg total) by mouth daily before breakfast AND 3 tablets (15 mg total) daily before supper. Take 2 tablet  by mouth daily before breakfast AND 3 tablets daily before supper.. 450 tablet 3  . glucose blood (ACCU-CHEK GUIDE) test strip Use as instructed 100 each 12  . metoprolol succinate (TOPROL-XL) 50 MG 24 hr tablet Take 1 tablet (50 mg total) by mouth daily. Take with or immediately following a meal. 90 tablet 3  . OZEMPIC, 0.25 OR 0.5 MG/DOSE, 2 MG/1.5ML SOPN Inject into the skin.    Vladimir Faster Glycol-Propyl Glycol 0.4-0.3 % SOLN Apply to eye.    Nelva Nay SOLOSTAR 300 UNIT/ML Solostar Pen Inject into the skin.    Marland Kitchen traZODone (DESYREL) 50 MG tablet Take 50 mg by mouth at bedtime.    Marland Kitchen azelastine (ASTELIN) 0.1 % nasal spray PLACE 1- 2 SPRAYS INTO BOTH NOSTRILS 2 (TWO) TIMES DAILY 30 mL 1  . dapagliflozin propanediol (FARXIGA) 10 MG TABS tablet Take 10 mg by mouth daily before breakfast. 90 tablet 3  . irbesartan (AVAPRO) 300 MG tablet USE AS DIRECTED DAILY 90 tablet 3  . metFORMIN (GLUCOPHAGE-XR) 500 MG 24 hr tablet Take 1 tablet (500 mg total) by mouth 2 (two) times daily with a meal. 180 tablet 3  .  mirtazapine (REMERON) 7.5 MG tablet TAKE 1 TABLET BY MOUTH EVERYDAY AT BEDTIME 90 tablet 3  . traMADol (ULTRAM) 50 MG tablet Take 1 tablet (50 mg total) by mouth every 6 (six) hours as needed. 30 tablet 0   No current facility-administered medications for this visit.    REVIEW OF SYSTEMS:    10 Point review of Systems  was done is negative except as noted above.  PHYSICAL EXAMINATION: ECOG PERFORMANCE STATUS: 1 - Symptomatic but completely ambulatory  . Vitals:   12/13/19 1105  BP: (!) 160/76  Pulse: 70  Resp: 18  Temp: (!) 97 F (36.1 C)  SpO2: 100%   Filed Weights   12/13/19 1105  Weight: 176 lb 4.8 oz (80 kg)   .Body mass index is 31.23 kg/m.  GENERAL:alert, in no acute distress and comfortable SKIN: no acute rashes, no significant lesions EYES: conjunctiva are pink and non-injected, sclera anicteric OROPHARYNX: MMM, no exudates, no oropharyngeal erythema or ulceration NECK: supple, no JVD LYMPH:  no palpable lymphadenopathy in the cervical, axillary or inguinal regions LUNGS: clear to auscultation b/l with normal respiratory effort HEART: regular rate & rhythm ABDOMEN:  normoactive bowel sounds , non tender, not distended. Extremity: no pedal edema PSYCH: alert & oriented x 3 with fluent speech NEURO: no focal motor/sensory deficits  LABORATORY DATA:  I have reviewed the data as listed  . CBC Latest Ref Rng & Units 12/13/2019 08/14/2019 10/10/2006  WBC 4.0 - 10.5 K/uL 4.3 4.8 3.3(L)  Hemoglobin 12.0 - 15.0 g/dL 13.1 13.7 12.5  Hematocrit 36 - 46 % 40.3 41.6 37.7  Platelets 150 - 400 K/uL 119(L) 160.0 121(L)    . CMP Latest Ref Rng & Units 12/13/2019 08/14/2019 04/06/2019  Glucose 70 - 99 mg/dL 324(H) 114(H) 130(H)  BUN 8 - 23 mg/dL 18 20 13   Creatinine 0.44 - 1.00 mg/dL 1.26(H) 0.98 0.79  Sodium 135 - 145 mmol/L 137 140 140  Potassium 3.5 - 5.1 mmol/L 3.9 4.0 3.7  Chloride 98 - 111 mmol/L 100 104 103  CO2 22 - 32 mmol/L 34(H) 29 30  Calcium 8.9 - 10.3  mg/dL 9.9 9.7 10.1  Total Protein 6.5 - 8.1 g/dL 7.1 7.2 7.3  Total Bilirubin 0.3 - 1.2 mg/dL 0.6 0.5 0.6  Alkaline Phos 38 - 126 U/L 163(H) 90 104  AST 15 - 41 U/L 12(L) 12 11  ALT 0 - 44 U/L 10 9 11      RADIOGRAPHIC STUDIES: I have personally reviewed the radiological images as listed and agreed with the findings in the report. No results found.  ASSESSMENT & PLAN:   74 yo with   1) RBC microcytosis without anemia PLAN: -Discussed patient's most recent labs from 11/07/2019, no anemia or leukopenia, moderate thrombocytopenia, RBC is elevated, MCV is low. -Advised pt that mild thrombocytopenia could be caused by medications, overactive bone marrow (making RBC), or  nutritional deficiencies. -Advised pt that smaller RBC is often attributed to iron deficiency, but could also be caused by a Thalassemia trait.  -Recommend pt begin daily B-complex or daily multivitamin  -Will get labs today  -Will see back in 2-3 weeks via phone   FOLLOW UP: Labs today Phone visit with Dr Irene Limbo in 2-3 weeks   All of the patients questions were answered with apparent satisfaction. The patient knows to call the clinic with any problems, questions or concerns.  I spent 35 mins counseling the patient face to face. The total time spent in the appointment was 45 minutes and more than 50% was on counseling and direct patient cares.    Sullivan Lone MD [place] AAHIVMS [facility] Hematology/Oncology Physician [facility]  (Office):       [phone] (Work cell):  [phone] (Fax):           [phone]  12/13/2019 4:53 PM  I, Yevette Edwards, am acting as  a scribe for Dr. Sullivan Lone.   .I have reviewed the above documentation for accuracy and completeness, and I agree with the above. Brunetta Genera MD

## 2019-12-13 NOTE — Telephone Encounter (Signed)
Scheduled per 10/14 los. Pt is aware of appt time and date.

## 2019-12-14 LAB — HAPTOGLOBIN: Haptoglobin: 57 mg/dL (ref 42–346)

## 2019-12-18 LAB — HGB FRACTIONATION CASCADE

## 2019-12-18 LAB — HGB FRACTIONATION BY HPLC
Hgb A2: 3.3 % — ABNORMAL HIGH (ref 1.8–3.2)
Hgb A: 72.5 % — ABNORMAL LOW (ref 96.4–98.8)
Hgb C: 24.2 % — ABNORMAL HIGH
Hgb E: 0 %
Hgb F: 0 % (ref 0.0–2.0)
Hgb S: 0 %
Hgb Variant: 0 %

## 2019-12-23 NOTE — Progress Notes (Signed)
HEMATOLOGY/ONCOLOGY CONSULTATION NOTE  Date of Service: 12/23/2019  Patient Care Team: Biagio Borg, MD as PCP - General (Internal Medicine)  PCP - Concha Pyo, NP  CHIEF COMPLAINTS/PURPOSE OF CONSULTATION:  Thalassemia ?  HISTORY OF PRESENTING ILLNESS:   Ashley Merritt is a wonderful 74 y.o. 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 Pt is accompanied today by Ashley Merritt. The pt reports that she is doing well overall.   The pt reports that she saw a hematologist at [facility] in the early 2000's. They believed that there was nothing to be done about her lab abnormalities. Her chronic knee and back pain are thought to be caused by Arthritis. Pt is taking a daily Vitamin D. Her Diabetes has not been well-controlled. She was previously on Metformin, but is currently trying new medications with her PCP.   Most recent lab results (11/07/2019) of CBC w/diff & CMP is as follows: all values are WNL except for RBC at 6.42, MCV at 69.5, MCH at 21.5, MCHC at 30.9, RDW at 16.7, PLT at 109K, Glucose at 193.  On review of systems, pt reports chronic knee, chronic back pain, fatigue and denies abdominal pain, leg swelling, abnormal/excessive bleeding and any other symptoms.   On PMHx the pt reports Anemia, Arthritis, Diabetes, HLD, HTN, GERD, Spine surgery, Knee surgery. On Social Hx the pt reports that she is a non-smoker.  On Family Hx the pt reports that her daughter has Hgb C trait, 1-2 of her other children have Sickle Cell trait.   INTERVAL HISTORY: I connected with  Ashley Merritt on 12/23/19 by telehone and verified that I am speaking with the correct person using two identifiers.   I discussed the limitations of evaluation and management by telemedicine. The patient expressed understanding and agreed to proceed. Patient's location: Home  Provider's location: [place] at [facility] is a wonderful 73 y.o.  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 The patient's last visit with [place] was on 12/13/2019. The pt reports that she is doing well overall.  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  Lab results (12/13/19) of CBC w/diff and CMP is as follows: all values are WNL except for RBC at 6.22, MCV at 64.8, MCH at 21.1, PLT at 119K, CO2 at 34, Glucose at 324, Creatinine at 1.26, AST at 12, ALP at 163,GFR Est at 43, Anion gap at 3. 12/13/2019 Reticulocytes is as follows: Retic Ct Pct at 0.7, Retic Ct Abs at 44.1, Immature Retic Fract at 6.2 12/13/2019 Hgb Fractionation shows all values are WNL except for Hgb A at 72.5, Hgb A2 at 3.3, Hgb C at 24.2. Alpha thal genotype studies consistent with alpha thalassemia minor in addition to hgb C trait. 12/13/2019 Iron Panel shows all values are WNL. 12/13/2019 Immature Retic Fract at 11.8 12/13/2019 Vitamin B12 at 882 12/13/2019 Haptoglobin at 57 12/13/2019 Ferritin at 272 12/13/2019 LDH at 146  MEDICAL HISTORY:  Past Medical History:  Diagnosis Date  . Allergy   . Anemia    age 78 only   . Anxiety   . Arthritis   . Blood transfusion without reported diagnosis    with back surgery   . Cataract    bilat removed   . Diabetes mellitus without complication ([place])   . GERD (gastroesophageal reflux disease)  past hx - no meds   . Hyperlipidemia   . Hypertension     SURGICAL HISTORY: Past Surgical History:  Procedure Laterality Date  . BUNIONECTOMY    . CATARACT EXTRACTION, BILATERAL    . COLONOSCOPY    . KNEE SURGERY    . POLYPECTOMY    . SPINE SURGERY      SOCIAL HISTORY: Social History   Socioeconomic History  . Marital status: Divorced    Spouse name: Not on file  . Number of children: Not on file  . Years of education: Not on file  . Highest education level: Not on file  Occupational History  . Not on file  Tobacco Use  . Smoking status: Never  Smoker  . Smokeless tobacco: Never Used  Vaping Use  . Vaping Use: Never used  Substance and Sexual Activity  . Alcohol use: Never  . Drug use: Never  . Sexual activity: Not Currently  Other Topics Concern  . Not on file  Social History Narrative  . Not on file   Social Determinants of Health   Financial Resource Strain:   . Difficulty of Paying Living Expenses: Not on file  Food Insecurity:   . Worried About Charity fundraiser in the Last Year: Not on file  . Ran Out of Food in the Last Year: Not on file  Transportation Needs:   . Lack of Transportation (Medical): Not on file  . Lack of Transportation (Non-Medical): Not on file  Physical Activity:   . Days of Exercise per Week: Not on file  . Minutes of Exercise per Session: Not on file  Stress:   . Feeling of Stress : Not on file  Social Connections:   . Frequency of Communication with Friends and Family: Not on file  . Frequency of Social Gatherings with Friends and Family: Not on file  . Attends Religious Services: Not on file  . Active Member of Clubs or Organizations: Not on file  . Attends Archivist Meetings: Not on file  . Marital Status: Not on file  Intimate Partner Violence:   . Fear of Current or Ex-Partner: Not on file  . Emotionally Abused: Not on file  . Physically Abused: Not on file  . Sexually Abused: Not on file    FAMILY HISTORY: Family History  Adopted: Yes  Problem Relation Age of Onset  . Diabetes Sister   . Diabetes Brother   . Colon cancer Neg Hx   . Colon polyps Neg Hx   . Esophageal cancer Neg Hx   . Rectal cancer Neg Hx   . Stomach cancer Neg Hx     ALLERGIES:  is allergic to amlodipine, aspirin, metformin, and sertraline hcl.  MEDICATIONS:  Current Outpatient Medications  Medication Sig Dispense Refill  . Accu-Chek Softclix Lancets lancets Use as instructed to test blood sugar 3 times daily E11.65 100 each 12  . atorvastatin (LIPITOR) 10 MG tablet Take 10 mg by  mouth daily.    Marland Kitchen azelastine (ASTELIN) 0.1 % nasal spray PLACE 1- 2 SPRAYS INTO BOTH NOSTRILS 2 (TWO) TIMES DAILY 30 mL 1  . cetirizine (ZYRTEC) 10 MG tablet Take 10 mg by mouth daily.    . Cholecalciferol (D2000 ULTRA STRENGTH) 50 MCG (2000 UT) CAPS Take by mouth.    . dapagliflozin propanediol (FARXIGA) 10 MG TABS tablet Take 10 mg by mouth daily before breakfast. 90 tablet 3  . diphenhydramine-acetaminophen (TYLENOL PM) 25-500 MG TABS tablet Take 1 tablet  by mouth at bedtime as needed.    Marland Kitchen glipiZIDE (GLUCOTROL) 5 MG tablet Take 2 tablets (10 mg total) by mouth daily before breakfast AND 3 tablets (15 mg total) daily before supper. Take 2 tablet  by mouth daily before breakfast AND 3 tablets daily before supper.. 450 tablet 3  . glucose blood (ACCU-CHEK GUIDE) test strip Use as instructed 100 each 12  . irbesartan (AVAPRO) 300 MG tablet USE AS DIRECTED DAILY 90 tablet 3  . metFORMIN (GLUCOPHAGE-XR) 500 MG 24 hr tablet Take 1 tablet (500 mg total) by mouth 2 (two) times daily with a meal. 180 tablet 3  . metoprolol succinate (TOPROL-XL) 50 MG 24 hr tablet Take 1 tablet (50 mg total) by mouth daily. Take with or immediately following a meal. 90 tablet 3  . mirtazapine (REMERON) 7.5 MG tablet TAKE 1 TABLET BY MOUTH EVERYDAY AT BEDTIME 90 tablet 3  . OZEMPIC, 0.25 OR 0.5 MG/DOSE, 2 MG/1.5ML SOPN Inject into the skin.    Vladimir Faster Glycol-Propyl Glycol 0.4-0.3 % SOLN Apply to eye.    Nelva Nay SOLOSTAR 300 UNIT/ML Solostar Pen Inject into the skin.    Marland Kitchen traMADol (ULTRAM) 50 MG tablet Take 1 tablet (50 mg total) by mouth every 6 (six) hours as needed. 30 tablet 0  . traZODone (DESYREL) 50 MG tablet Take 50 mg by mouth at bedtime.     No current facility-administered medications for this visit.    REVIEW OF SYSTEMS:   A 10+ POINT REVIEW OF SYSTEMS WAS OBTAINED including neurology, dermatology, psychiatry, cardiac, respiratory, lymph, extremities, GI, GU, Musculoskeletal, constitutional, breasts,  reproductive, HEENT.  All pertinent positives are noted in the HPI.  All others are negative.   PHYSICAL EXAMINATION: ECOG PERFORMANCE STATUS: 1 - Symptomatic but completely ambulatory LABORATORY DATA:  I have reviewed the data as listed  . CBC Latest Ref Rng & Units 12/13/2019 08/14/2019 10/10/2006  WBC 4.0 - 10.5 K/uL 4.3 4.8 3.3(L)  Hemoglobin 12.0 - 15.0 g/dL 13.1 13.7 12.5  Hematocrit 36 - 46 % 40.3 41.6 37.7  Platelets 150 - 400 K/uL 119(L) 160.0 121(L)   . CBC    Component Value Date/Time   WBC 4.3 12/13/2019 1148   RBC 6.12 (H) 12/13/2019 1149   RBC 6.22 (H) 12/13/2019 1148   HGB 13.1 12/13/2019 1148   HCT 40.3 12/13/2019 1148   PLT 119 (L) 12/13/2019 1148   MCV 64.8 (L) 12/13/2019 1148   MCH 21.1 (L) 12/13/2019 1148   MCHC 32.5 12/13/2019 1148   RDW 14.9 12/13/2019 1148   LYMPHSABS 1.6 12/13/2019 1148   MONOABS 0.3 12/13/2019 1148   EOSABS 0.1 12/13/2019 1148   BASOSABS 0.0 12/13/2019 1148    . CMP Latest Ref Rng & Units 12/13/2019 08/14/2019 04/06/2019  Glucose 70 - 99 mg/dL 324(H) 114(H) 130(H)  BUN 8 - 23 mg/dL 18 20 13   Creatinine 0.44 - 1.00 mg/dL 1.26(H) 0.98 0.79  Sodium 135 - 145 mmol/L 137 140 140  Potassium 3.5 - 5.1 mmol/L 3.9 4.0 3.7  Chloride 98 - 111 mmol/L 100 104 103  CO2 22 - 32 mmol/L 34(H) 29 30  Calcium 8.9 - 10.3 mg/dL 9.9 9.7 10.1  Total Protein 6.5 - 8.1 g/dL 7.1 7.2 7.3  Total Bilirubin 0.3 - 1.2 mg/dL 0.6 0.5 0.6  Alkaline Phos 38 - 126 U/L 163(H) 90 104  AST 15 - 41 U/L 12(L) 12 11  ALT 0 - 44 U/L 10 9 11    Alpha-Thalassemia GenotypR Order:  [id number] Status:  Final result Visible to patient:  Yes (not seen) Next appt:  01/21/2020 at 11:10 AM in Internal Medicine Dorita Sciara, MD) Dx:  Hemoglobinopathy ([place]); RBC microcytosis  0 Result Notes Component 2 wk ago  Alpha-Thalassemia Comment:   Comment: (NOTE)  Test: Alpha-Thalassemia, DNA Analysis  Result:    Alpha-thalassemia minor, alpha-/alpha-  Mutation(s)  identified: alpha3.7 and alpha3.7        Contains abnormal dataHgb Fractionation by [place] Order: [id number] Status:  Final result Visible to patient:  Yes (not seen) Next appt:  01/21/2020 at 11:10 AM in Internal Medicine Dorita Sciara, MD)  0 Result Notes  Ref Range & Units 2 wk ago  (12/13/19) 2 wk ago  (12/13/19)  Hgb F 0.0 - 2.0 % 0.0  Reflexed   Hgb A 96.4 - 98.8 % 72.5Low  Reflexed   Hgb A2 1.8 - 3.2 % 3.3High    Hgb S 0.0 % 0.0  Reflexed   Hgb C 0.0 % 24.2High    Hgb E 0.0 % 0.0    Hgb Variant 0.0 % 0.0    Final Interpretation:  Comment    Comment: (NOTE)  Hemoglobin pattern and concentration are consistent with  Hgb C trait (heterozygous). Hgb C is observed at a  concentration less than the interpretation range which  suggests: transfusion of a homozygous Hgb C patient,  Hgb C trait with alpha-thalassemia, or Hgb C trait with  iron deficiency anemia.         RADIOGRAPHIC STUDIES: I have personally reviewed the radiological images as listed and agreed with the findings in the report. No results found.  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   FOLLOW UP: RTC with PCP   The total time spent in the appt was 20 minutes and more than 50% was on counseling and direct patient cares.  All of the patient's questions were answered with apparent satisfaction. The patient knows to call the clinic with any problems, questions or concerns.    Sullivan Lone MD [place] AAHIVMS [facility] Hematology/Oncology Physician [facility]  (Office):       [phone] (Work cell):  [phone] (Fax):           [phone]  12/23/2019 8:05 PM  I, Yevette Edwards, am acting as a scribe for Dr. Sullivan Lone.   .I have reviewed the above documentation for accuracy and completeness, and I agree with the above. Brunetta Genera MD

## 2019-12-25 LAB — ALPHA-THALASSEMIA GENOTYPR

## 2019-12-27 ENCOUNTER — Inpatient Hospital Stay (HOSPITAL_BASED_OUTPATIENT_CLINIC_OR_DEPARTMENT_OTHER): Payer: 59 | Admitting: Hematology

## 2019-12-27 DIAGNOSIS — D582 Other hemoglobinopathies: Secondary | ICD-10-CM | POA: Diagnosis not present

## 2020-01-07 ENCOUNTER — Telehealth: Payer: Self-pay | Admitting: Internal Medicine

## 2020-01-07 NOTE — Telephone Encounter (Signed)
FYI, Patient called to cancel her appointment and stated that she had to change health insurance and will be getting further treatment from a different Dr.

## 2020-01-07 NOTE — Telephone Encounter (Signed)
Noted  

## 2020-01-21 ENCOUNTER — Ambulatory Visit: Payer: Medicare Other | Admitting: Internal Medicine

## 2020-02-14 ENCOUNTER — Ambulatory Visit: Payer: Medicare Other | Admitting: Internal Medicine

## 2020-02-15 ENCOUNTER — Ambulatory Visit: Payer: 59 | Admitting: Podiatry

## 2020-03-19 ENCOUNTER — Ambulatory Visit: Payer: 59 | Admitting: Podiatry

## 2020-03-27 ENCOUNTER — Other Ambulatory Visit: Payer: Self-pay

## 2020-03-31 ENCOUNTER — Other Ambulatory Visit: Payer: Self-pay

## 2020-03-31 ENCOUNTER — Encounter: Payer: Self-pay | Admitting: Internal Medicine

## 2020-03-31 ENCOUNTER — Ambulatory Visit (INDEPENDENT_AMBULATORY_CARE_PROVIDER_SITE_OTHER): Payer: 59 | Admitting: Internal Medicine

## 2020-03-31 VITALS — BP 138/82 | HR 82 | Ht 63.0 in | Wt 178.1 lb

## 2020-03-31 DIAGNOSIS — E119 Type 2 diabetes mellitus without complications: Secondary | ICD-10-CM

## 2020-03-31 DIAGNOSIS — Z794 Long term (current) use of insulin: Secondary | ICD-10-CM

## 2020-03-31 DIAGNOSIS — E1165 Type 2 diabetes mellitus with hyperglycemia: Secondary | ICD-10-CM

## 2020-03-31 DIAGNOSIS — E1142 Type 2 diabetes mellitus with diabetic polyneuropathy: Secondary | ICD-10-CM | POA: Diagnosis not present

## 2020-03-31 LAB — POCT GLUCOSE (DEVICE FOR HOME USE): Glucose Fasting, POC: 281 mg/dL — AB (ref 70–99)

## 2020-03-31 LAB — POCT GLYCOSYLATED HEMOGLOBIN (HGB A1C): Hemoglobin A1C: 11.2 % — AB (ref 4.0–5.6)

## 2020-03-31 MED ORDER — INSULIN LISPRO (1 UNIT DIAL) 100 UNIT/ML (KWIKPEN): 10.0000 [IU] | PEN_INJECTOR | Freq: Three times a day (TID) | SUBCUTANEOUS | 6 refills | Status: AC

## 2020-03-31 MED ORDER — DEXCOM G6 SENSOR MISC: 1.0000 | 3 refills | Status: AC

## 2020-03-31 MED ORDER — TOUJEO SOLOSTAR 300 UNIT/ML ~~LOC~~ SOPN: 25.0000 [IU] | PEN_INJECTOR | Freq: Every day | SUBCUTANEOUS | 6 refills | Status: DC

## 2020-03-31 MED ORDER — DEXCOM G6 TRANSMITTER MISC: 1.0000 | 3 refills | Status: AC

## 2020-03-31 NOTE — Patient Instructions (Addendum)
-   STOP Glipizide  - Continue Januvia 100 mg daily  - Continue Toujeo at 25 units daily  - Increase Humalog to 10 units with each meal         HOW TO TREAT LOW BLOOD SUGARS (Blood sugar LESS THAN 70 MG/DL)  Please follow the RULE OF 15 for the treatment of hypoglycemia treatment (when your (blood sugars are less than 70 mg/dL)    STEP 1: Take 15 grams of carbohydrates when your blood sugar is low, which includes:   3-4 GLUCOSE TABS  OR  3-4 OZ OF JUICE OR REGULAR SODA OR  ONE TUBE OF GLUCOSE GEL     STEP 2: RECHECK blood sugar in 15 MINUTES STEP 3: If your blood sugar is still low at the 15 minute recheck --> then, go back to STEP 1 and treat AGAIN with another 15 grams of carbohydrates.

## 2020-03-31 NOTE — Progress Notes (Signed)
Name: Ashley Merritt  Age/ Sex: 75 y.o., female   MRN/ DOB: [id number], 05-13-1945     PCP: Finis Bud, NP   Reason for Endocrinology Evaluation: Type 2 Diabetes Mellitus  Initial Endocrine Consultative Visit: 07/14/2018    PATIENT IDENTIFIER: Ashley Merritt is a 75 y.o. female with a past medical history of HTN, GERD, T2DM and dyslipidemia. The patient has followed with Endocrinology clinic since 07/14/18 for consultative assistance with management of her diabetes.  DIABETIC HISTORY:  [name] was diagnosed with T2DM in 1990 during routine workup. She was initially started on insulin but has been off for many years. She was on Metformin with GI side effects, SU, and Januvia. She went back to insulin briefly in 2014 due to hyperglycemia secondary to a back intra-articular injection. Her hemoglobin A1c has ranged from 7.1% in 2019, peaking at 8.1% in 2018.    Wilder Glade started by PCP 04/2019 but this was stopped by her visit with me in 03/2020 as well as Metformin    Insulin started in 01/2020 SUBJECTIVE:   During the last visit (07/17/2019): A1 c 7.0 % . Continued  Glipizide, Metformin, farxiga  and [place]      Today (03/31/2020): [name] is here for a follow  up on diabetes management. She has NOT been to our clinic in 8 months.  She didn't check her sugar for the past month. The patient has not had hypoglycemic episodes since the last clinic visit.     Having issues with frequency but no polydipsia   HOME DIABETES REGIMEN:  Glipizide 5 mg, 2 tabs BID  Januvia 100 mg daily  Touejo 25 units daily  Humalog  6 units TID      METER DOWNLOAD SUMMARY: 1/17-1/31/2022 Average Number Tests/Day = 2.4 Overall Mean FS Glucose = 250 Standard Deviation = 55  BG Ranges: Low = 161 High = 407    Hypoglycemic Events/30 Days: BG < 50 = 0 Episodes of symptomatic severe hypoglycemia = 0      HISTORY:  Past Medical History:  Past Medical History:  Diagnosis  Date  . Allergy   . Anemia    age 58 only   . Anxiety   . Arthritis   . Blood transfusion without reported diagnosis    with back surgery   . Cataract    bilat removed   . Diabetes mellitus without complication ([place])   . GERD (gastroesophageal reflux disease)    past hx - no meds   . Hyperlipidemia   . Hypertension    Past Surgical History:  Past Surgical History:  Procedure Laterality Date  . BUNIONECTOMY    . CATARACT EXTRACTION, BILATERAL    . COLONOSCOPY    . KNEE SURGERY    . POLYPECTOMY    . SPINE SURGERY      Social History:  reports that she has never smoked. She has never used smokeless tobacco. She reports that she does not drink alcohol and does not use drugs. Family History:  Family History  Adopted: Yes  Problem Relation Age of Onset  . Diabetes Sister   . Diabetes Brother   . Colon cancer Neg Hx   . Colon polyps Neg Hx   . Esophageal cancer Neg Hx   . Rectal cancer Neg Hx   . Stomach cancer Neg Hx      HOME MEDICATIONS: Allergies as of 03/31/2020      Reactions   Amlodipine Itching   Scalp  itching and hair loss   Aspirin    Other reaction(s): Abdominal Pain   Metformin    REACTION: gi upset even 1 x 500 mg   Sertraline Hcl    REACTION: "couldn"t think"      Medication List       Accurate as of March 31, 2020  1:56 PM. If you have any questions, ask your nurse or doctor.        STOP taking these medications   azelastine 0.1 % nasal spray Commonly known as: ASTELIN Stopped by: Dorita Sciara, MD   cetirizine 10 MG tablet Commonly known as: ZYRTEC Stopped by: Dorita Sciara, MD   Farxiga 10 MG Tabs tablet Generic drug: dapagliflozin propanediol Stopped by: Dorita Sciara, MD   irbesartan 300 MG tablet Commonly known as: AVAPRO Stopped by: Dorita Sciara, MD   metFORMIN 500 MG 24 hr tablet Commonly known as: GLUCOPHAGE-XR Stopped by: Dorita Sciara, MD   mirtazapine 7.5 MG tablet Commonly  known as: REMERON Stopped by: Dorita Sciara, MD   Ozempic (0.25 or 0.5 MG/DOSE) 2 MG/1.5ML Sopn Generic drug: Semaglutide(0.25 or 0.5MG /DOS) Stopped by: Dorita Sciara, MD   traMADol 50 MG tablet Commonly known as: ULTRAM Stopped by: Dorita Sciara, MD     TAKE these medications   Accu-Chek Guide test strip Generic drug: glucose blood Use as instructed   Accu-Chek Softclix Lancets lancets Use as instructed to test blood sugar 3 times daily E11.65   atorvastatin 10 MG tablet Commonly known as: LIPITOR Take 10 mg by mouth daily.   B-D ULTRAFINE III SHORT PEN 31G X 8 MM Misc Generic drug: Insulin Pen Needle SMARTSIG:1 Each SUB-Q Daily   D2000 Ultra Strength 50 MCG (2000 UT) Caps Generic drug: Cholecalciferol Take by mouth.   Dexcom G6 Sensor Misc 1 Device by Does not apply route as directed. Started by: Dorita Sciara, MD   Dexcom G6 Transmitter Misc 1 Device by Does not apply route as directed. Started by: Dorita Sciara, MD   diphenhydramine-acetaminophen 25-500 MG Tabs tablet Commonly known as: TYLENOL PM Take 1 tablet by mouth at bedtime as needed.   glipiZIDE 5 MG tablet Commonly known as: GLUCOTROL Take 2 tablets (10 mg total) by mouth daily before breakfast AND 3 tablets (15 mg total) daily before supper. Take 2 tablet  by mouth daily before breakfast AND 3 tablets daily before supper..   HumaLOG KwikPen 100 UNIT/ML KwikPen Generic drug: insulin lispro Humalog KwikPen (U-100) Insulin 100 unit/mL subcutaneous  Inject 6 units 3 times a day by subcutaneous route with meals.   irbesartan-hydrochlorothiazide 300-12.5 MG tablet Commonly known as: AVALIDE irbesartan 300 mg-hydrochlorothiazide 12.5 mg tablet  TAKE 1 TABLET BY MOUTH EVERY DAY   metoprolol succinate 50 MG 24 hr tablet Commonly known as: TOPROL-XL Take 1 tablet (50 mg total) by mouth daily. Take with or immediately following a meal.   Polyethyl Glycol-Propyl Glycol  0.4-0.3 % Soln Apply to eye.   sitaGLIPtin 100 MG tablet Commonly known as: JANUVIA Take 100 mg by mouth daily.   Toujeo SoloStar 300 UNIT/ML Solostar Pen Generic drug: insulin glargine (1 Unit Dial) Inject into the skin.   traZODone 50 MG tablet Commonly known as: DESYREL Take 50 mg by mouth at bedtime.        OBJECTIVE:   Vital Signs: BP 138/82   Pulse 82   Ht 5\' 3"  (1.6 m)   Wt 178 lb 2 oz (80.8 kg)  SpO2 98%   BMI 31.55 kg/m   Wt Readings from Last 3 Encounters:  03/31/20 178 lb 2 oz (80.8 kg)  12/13/19 176 lb 4.8 oz (80 kg)  08/14/19 171 lb (77.6 kg)     Exam: General: Pt appears well and is in NAD  Lungs: Clear with good BS bilat with no rales, rhonchi, or wheezes  Heart: RRR with normal S1 and S2 and no gallops; no murmurs; no rub  Abdomen: Normoactive bowel sounds, soft, nontender, without masses or organomegaly palpable  Extremities: No pretibial edema. No tremor.  Skin: Normal texture and temperature to palpation. No rash noted.  Neuro: MS is good with appropriate affect, pt is alert and Ox3       DM foot exam: 04/18/2019 The skin of the feet is intact without sores or ulcerations. The pedal pulses are 2+ on right and 2+ on left. The sensation is decreased at the heels to a screening 5.07, 10 gram monofilament bilaterally         DATA REVIEWED:  Lab Results  Component Value Date   HGBA1C 11.2 (A) 03/31/2020   HGBA1C 7.0 (A) 07/17/2019   HGBA1C 8.3 (H) 04/06/2019   Lab Results  Component Value Date   MICROALBUR <0.7 08/14/2019   LDLCALC 41 08/14/2019   CREATININE 1.26 (H) 12/13/2019   Lab Results  Component Value Date   MICRALBCREAT 2.1 08/14/2019     Lab Results  Component Value Date   CHOL 122 08/14/2019   HDL 69.30 08/14/2019   LDLCALC 41 08/14/2019   TRIG 60.0 08/14/2019   CHOLHDL 2 08/14/2019         ASSESSMENT / PLAN / RECOMMENDATIONS:   1) Type 2 Diabetes Mellitus, Poorly  controlled, With neuropathic complications  - Most recent A1c of 11.2 %. Goal A1c < 7.0 %.    - Poorly controlled diabetes in the past 8 months. Was having insurance issues and was not able to get in.  - Metformin and  Wilder Glade has been discontinued, she believes due to GI issues  - Apparently she has been tried on one weekly GLP-1 agonists  - Insulin started in 01/2020 by PCP for hyperglycemia  - Will stop Glipizide and adjust insulin as below  - Dexcom prescription has been sent to [facility]    MEDICATIONS:  Stop Glipizide   Continue Januvia 100 mg daily   Continue Toujeo 25 units daily   Increase Humalog to 10  units with each meal   EDUCATION / INSTRUCTIONS:  BG monitoring instructions: Patient is instructed to check her blood sugars 2 times a day, Before breakfast and supper time .  Call [place] [facility] clinic if: BG persistently < 70  . I reviewed the Rule of 15 for the treatment of hypoglycemia in detail with the patient. Literature supplied.   F/U in 2 months   Signed electronically by: Mack Guise, MD  [facility] End[facility]in[facility]  [facility] [address] [place], [place] [address] Phone: [phone] FAX: [phone]   CC: Finis Bud, NP [address] [place] [address] Phone: [phone]  Fax: [phone]  Return to Endocrinology clinic as below: Future Appointments  Date Time Provider [place]  03/31/2020  2:00 PM Mavryk Pino, Melanie Crazier, MD [facility] None  04/29/2020  3:15 PM Gardiner Barefoot, DPM [facility]

## 2020-04-22 ENCOUNTER — Telehealth: Payer: Self-pay | Admitting: Internal Medicine

## 2020-04-22 DIAGNOSIS — E1165 Type 2 diabetes mellitus with hyperglycemia: Secondary | ICD-10-CM

## 2020-04-22 NOTE — Telephone Encounter (Signed)
Please let the pt know that I have put in a referral to our diabetic educator for dexcom training . She will be contacted by them

## 2020-04-22 NOTE — Telephone Encounter (Signed)
Pt called to let Dr.Shamleffer know she received her Dexcom today. Pt states she would now like to get someone to help her with understanding and using it.

## 2020-04-29 ENCOUNTER — Other Ambulatory Visit: Payer: Self-pay

## 2020-04-29 ENCOUNTER — Ambulatory Visit (INDEPENDENT_AMBULATORY_CARE_PROVIDER_SITE_OTHER): Payer: 59 | Admitting: Podiatry

## 2020-04-29 ENCOUNTER — Encounter: Payer: Self-pay | Admitting: Podiatry

## 2020-04-29 DIAGNOSIS — B351 Tinea unguium: Secondary | ICD-10-CM

## 2020-04-29 DIAGNOSIS — M79674 Pain in right toe(s): Secondary | ICD-10-CM | POA: Diagnosis not present

## 2020-04-29 DIAGNOSIS — E119 Type 2 diabetes mellitus without complications: Secondary | ICD-10-CM | POA: Diagnosis not present

## 2020-04-29 DIAGNOSIS — M79675 Pain in left toe(s): Secondary | ICD-10-CM | POA: Diagnosis not present

## 2020-04-29 NOTE — Progress Notes (Signed)
This patient returns to my office for at risk foot care.  This patient requires this care by a professional since this patient will be at risk due to having type 2 diabetes and thrombocytopenia.   This patient is unable to cut nails herself since the patient cannot reach her nails.These nails are painful walking and wearing shoes.  This patient presents for at risk foot care today.  General Appearance  Alert, conversant and in no acute stress.  Vascular  Dorsalis pedis and posterior tibial  pulses are weakly  palpable  bilaterally.  Capillary return is within normal limits  bilaterally. Temperature is within normal limits  bilaterally.  Neurologic  Senn-Weinstein monofilament wire test within normal limits  bilaterally. Muscle power within normal limits bilaterally.  Nails Thick disfigured discolored nails with subungual debris  from hallux to fifth toes bilaterally. No evidence of bacterial infection or drainage bilaterally.  Orthopedic  No limitations of motion  feet .  No crepitus or effusions noted.  No bony pathology or digital deformities noted.  Skin  normotropic skin with no porokeratosis noted bilaterally.  No signs of infections or ulcers noted.   Scar over right big toe.  Onychomycosis  Pain in right toes  Pain in left toes  Consent was obtained for treatment procedures.   Mechanical debridement of nails 1-5  bilaterally performed with a nail nipper.  Filed with dremel without incident.    Return office visit   10 weeks                   Told patient to return for periodic foot care and evaluation due to potential at risk complications.   Gardiner Barefoot DPM

## 2020-05-05 ENCOUNTER — Telehealth: Payer: Self-pay | Admitting: Internal Medicine

## 2020-05-05 NOTE — Telephone Encounter (Signed)
Pt called regarding her toujeo instructions.  Pt states she has been taking her 25 units at night but after looking at her last AVS she sees it says to take at 2 pm. Pt is wondering if she should be taking it at 2 pm or is it ok to be taking it at nighttime like she has been?   Ph# [phone]

## 2020-05-05 NOTE — Telephone Encounter (Addendum)
Inform patient that [place] may be given at any time of the day; however, once you have chosen a time, it is best to inject Toujeo within three hours of that time every day. Toujeo is a long-acting insulin that lasts up to 24 to 36 hours.

## 2020-05-28 ENCOUNTER — Ambulatory Visit (INDEPENDENT_AMBULATORY_CARE_PROVIDER_SITE_OTHER): Payer: 59 | Admitting: Family Medicine

## 2020-05-28 ENCOUNTER — Other Ambulatory Visit: Payer: Self-pay

## 2020-05-28 ENCOUNTER — Ambulatory Visit (INDEPENDENT_AMBULATORY_CARE_PROVIDER_SITE_OTHER): Payer: 59

## 2020-05-28 ENCOUNTER — Encounter: Payer: Self-pay | Admitting: Family Medicine

## 2020-05-28 VITALS — BP 140/60 | HR 65 | Ht 63.0 in | Wt 184.0 lb

## 2020-05-28 DIAGNOSIS — G8929 Other chronic pain: Secondary | ICD-10-CM

## 2020-05-28 DIAGNOSIS — M5441 Lumbago with sciatica, right side: Secondary | ICD-10-CM | POA: Diagnosis not present

## 2020-05-28 DIAGNOSIS — M25562 Pain in left knee: Secondary | ICD-10-CM | POA: Diagnosis not present

## 2020-05-28 DIAGNOSIS — M7061 Trochanteric bursitis, right hip: Secondary | ICD-10-CM

## 2020-05-28 DIAGNOSIS — M25561 Pain in right knee: Secondary | ICD-10-CM

## 2020-05-28 MED ORDER — GABAPENTIN 100 MG PO CAPS: 100.0000 mg | ORAL_CAPSULE | Freq: Every evening | ORAL | 3 refills | Status: AC | PRN

## 2020-05-28 NOTE — Progress Notes (Signed)
   Ashley Merritt, am serving as a Education administrator for Dr. Lynne Leader.  Ashley Merritt is a 75 y.o. female who presents to [place] at [facility] today for Bilateral Knee and hip Pain. Patient last seen by Dr. Georgina Snell on 04/24/2019 for Right hip and low back pain. Patient states Bilateral knee pain X1 year (since last visit) states been tolerating the pain, but now the pain gets extreme when walking too much. States on Tuesday after mopping the floor she could barely walk. R knee worse than left with pain that radiates down her R leg. Locates pain to R lateral and anterior knee. L knee pain located to anterior knee. Knee pain is persistent when laying down, patient has tried Tylenol, heat, and asper cream.  Patient states is also having more intense R hip pain since the last time we saw her. Patient would like to be shown some exercises to help her at home because she has been trying some on her own and is not sure if they are correct or not.    Pertinent review of systems:   Relevant historical information:    Exam:  Ht 5\' 3"  (1.6 m)   BMI 31.55 kg/m  General: Well Developed, well nourished, and in no acute distress.   MSK:     Lab and Radiology Results No results found for this or any previous visit (from the past 72 hour(s)). No results found.     Assessment and Plan: 75 y.o. female with    PDMP not reviewed this encounter. No orders of the defined types were placed in this encounter.  No orders of the defined types were placed in this encounter.    Discussed warning signs or symptoms. Please see discharge instructions. Patient expresses understanding.

## 2020-05-28 NOTE — Progress Notes (Addendum)
Ashley Merritt is a 75 y.o. female who presents to [place] at [facility] today for Bilateral Knee and hip Pain    Right knee worse than left.  Pain has been ongoing in both knees for over a year now.  Pain is worse with activity better with rest.  She is tried some Aspercreme which helps some.  She denies any injury recently.  Additionally she notes pain in her right lower back and right lateral hip.  Additionally she has pain radiating down her leg into the right lateral calf.  This is worse with activity and better with rest.  She has not had any specific treatment for this recently.  Patient does not drive much and cares for a family member.  She has done well with home health physical therapy in the past and is interested in that again if needed.  Pertinent review of systems: No fevers or chills  Relevant historical information: Diabetes.  Lumbar fusion in the past.   Exam:  BP 140/60 (BP Location: Left Arm, Patient Position: Sitting, Cuff Size: Normal)   Pulse 65   Ht 5\' 3"  (1.6 m)   Wt 184 lb (83.5 kg)   SpO2 98%   BMI 32.59 kg/m  General: Well Developed, well nourished, and in no acute distress.   MSK: L-spine normal-appearing nontender midline.  Tender palpation right lumbar paraspinal musculature decreased lumbar motion.  Right hip normal-appearing Tender palpation greater trochanter.  Right knee normal-appearing normal motion with crepitation. Tender palpation medial joint line. Stable ligamentous exam. Positive medial McMurray's test. Intact strength.  Left knee mild effusion otherwise normal-appearing Motion intact however crepitation is present. Tender palpation medial joint line. Stable ligamentous exam. Positive medial McMurray's test. Intact strength.     Lab and Radiology Results  Procedure: Real-time Ultrasound Guided Injection of right knee superior lateral patellar space Device: Philips Affiniti 50G Images permanently  stored and available for review in PACS Verbal informed consent obtained.  Discussed risks and benefits of procedure. Warned about infection bleeding damage to structures skin hypopigmentation and fat atrophy among others. Patient expresses understanding and agreement Time-out conducted.   Noted no overlying erythema, induration, or other signs of local infection.   Skin prepped in a sterile fashion.   Local anesthesia: Topical Ethyl chloride.   With sterile technique and under real time ultrasound guidance:  40 mg of Kenalog and 2 mL of Marcaine injected into knee joint. Fluid seen entering the joint capsule.   Completed without difficulty   Pain immediately resolved suggesting accurate placement of the medication.   Advised to call if fevers/chills, erythema, induration, drainage, or persistent bleeding.   Images permanently stored and available for review in the ultrasound unit.  Impression: Technically successful ultrasound guided injection.   X-ray images bilateral knees obtained today personally and independently interpreted  Right knee: Moderate DJD.  Chondrocalcinosis present.  Left knee: Mild to moderate medial compartment DJD.  Calcifications lateral meniscus present.  Await formal radiology review   EXAM: MRI LUMBAR SPINE WITHOUT CONTRAST  TECHNIQUE: Multiplanar, multisequence MR imaging of the lumbar spine was performed. No intravenous contrast was administered.  COMPARISON:  None.  FINDINGS: Segmentation: Conventional anatomy assumed, with the last open disc space designated L5-S1.  Alignment: There is moderate artifact associated with the surgical hardware at the L4-5 level. The alignment is near anatomic with a mild convex left scoliosis.  Vertebrae: No worrisome osseous lesion, acute fracture or pars defect. As above, moderate artifact associated  with the surgical hardware obscures portions of the lower lumbar anatomy.The visualized sacroiliac joints  appear unremarkable. There are postsurgical changes in the left iliac bone attributed to bone harvesting.  Conus medullaris: Extends to the L1-2 level and appears normal.  Paraspinal and other soft tissues: No significant paraspinal findings.  Disc levels:  Mild disc bulging at T10-11, T11-12 and T12-L1 without resulting spinal stenosis or nerve root encroachment.  L1-2: Mild disc bulging eccentric to the right. No spinal stenosis or nerve root encroachment.  L2-3: Mild disc bulging and facet hypertrophy. No spinal stenosis or nerve root encroachment.  L3-4: This level is largely obscured by artifact related to the L4-5 hardware. On the sagittal images, there is loss of disc height with annular disc bulging and endplate osteophytes. There is suspicion of spinal stenosis and asymmetric right-sided foraminal narrowing.  L4-5: Status post laminectomy and posterior fusion with associated susceptibility artifact. Detail is limited, although the spinal canal appears adequately decompressed. No high-grade foraminal narrowing suspected.  L5-S1: Disc height and hydration are maintained. The spinal canal and lateral recesses are patent. Foraminal cyst is limited by artifact from the fusion hardware, although no high-grade foraminal narrowing suspected.  IMPRESSION: 1. Status post L4-5 laminectomy and posterior fusion. Detail is limited, although the spinal canal appears adequately decompressed. 2. 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 Although largely obscured by artifact, there is suspicion of spinal stenosis and asymmetric right-sided foraminal narrowing. Consider further evaluation with lumbar spine CT. 3. Mild spondylosis at the other levels as detailed above. No other significant spinal stenosis or nerve root encroachment identified.   Electronically Signed   By: Richardean Sale M.D.   On: 03/05/2019  09:30 I, Lynne Leader, personally (independently) visualized and performed the interpretation of the images attached in this note.    Assessment and Plan: 75 y.o. female with bilateral knee pain, right hip pain, right-sided lumbar radiculopathy.  Plan for right knee injection today.  Consider right knee injection in the near future.  Additionally refer to home health physical therapy.  Will use home health PT as she has difficulty leaving the house.  She is that used at home health PT in the past and had benefit from it.    Additionally will use gabapentin especially at bedtime for the radiculopathy pain.  Recheck in 1 month.  Return sooner if needed.   PDMP not reviewed this encounter. Orders Placed This Encounter  Procedures  . [place] LIMITED JOINT SPACE STRUCTURES LOW RIGHT    Standing Status:   Future    Number of Occurrences:   1    Standing Expiration Date:   05/28/2021    Order Specific Question:   Reason for Exam (SYMPTOM  OR DIAGNOSIS REQUIRED)    Answer:   Bilateral knee pain    Order Specific Question:   Preferred imaging location?    Answer:   Internal  . DG Knee AP/LAT W/Sun[place] Right    Standing Status:   Future    Number of Occurrences:   1    Standing Expiration Date:   05/28/2021    Order Specific Question:   Reason for Exam (SYMPTOM  OR DIAGNOSIS REQUIRED)    Answer:   BL knee pain    Order Specific Question:   Preferred imaging location?    Answer:   [facility]  . DG Knee AP/LAT W/Sunrise Left    Standing Status:   Future    Number of  Occurrences:   1    Standing Expiration Date:   05/28/2021    Order Specific Question:   Reason for Exam (SYMPTOM  OR DIAGNOSIS REQUIRED)    Answer:   BL knee pain    Order Specific Question:   Preferred imaging location?    Answer:   [facility]  . Ambulatory referral to Home Health    Referral Priority:   Routine    Referral Type:   Home Health Care    Referral Reason:   Specialty Services Required     Requested Specialty:   [place]    Number of Visits Requested:   1   Meds ordered this encounter  Medications  . gabapentin (NEURONTIN) 100 MG capsule    Sig: Take 1-2 capsules (100-200 mg total) by mouth at bedtime as needed (nerve pain).    Dispense:  30 capsule    Refill:  3     Discussed warning signs or symptoms. Please see discharge instructions. Patient expresses understanding.   The above documentation has been reviewed and is accurate and complete Lynne Leader, M.D.

## 2020-05-28 NOTE — Patient Instructions (Addendum)
Thank you for coming in today.  Call or go to the ER if you develop a large red swollen joint with extreme pain or oozing puss.   Recheck in 1 month.   Plan for home health PT.   Let me know if you have a problem.   Please get an Xray today before you leave

## 2020-05-29 ENCOUNTER — Ambulatory Visit: Payer: 59 | Admitting: Internal Medicine

## 2020-05-29 NOTE — Progress Notes (Signed)
X-ray right knee shows mild to medium arthritis changes.

## 2020-05-29 NOTE — Progress Notes (Signed)
X-ray left knee shows mild arthritis changes

## 2020-06-05 ENCOUNTER — Other Ambulatory Visit: Payer: Self-pay

## 2020-06-05 ENCOUNTER — Encounter: Payer: Self-pay | Admitting: Dietician

## 2020-06-05 ENCOUNTER — Encounter: Payer: 59 | Attending: Internal Medicine | Admitting: Dietician

## 2020-06-05 DIAGNOSIS — E119 Type 2 diabetes mellitus without complications: Secondary | ICD-10-CM | POA: Diagnosis not present

## 2020-06-05 DIAGNOSIS — E1142 Type 2 diabetes mellitus with diabetic polyneuropathy: Secondary | ICD-10-CM | POA: Diagnosis present

## 2020-06-05 NOTE — Progress Notes (Signed)
Diabetes Self-Management Education  Visit Type: First/Initial  Appt. Start Time: 1310 Appt. End Time: 4098  06/05/2020  [name], identified by name and date of birth, is a 75 y.o. female with a diagnosis of Diabetes: Type 2.   ASSESSMENT Patient is here today with her son.  She would like to learn more about nutrition.  She was also to have Dexcom training but forgot the reader.  We will complete dexcom training after her MD appointment 4/8.  We watched the Dexcom video learning how to start this.  History includes:  Type 2 Diabetes, HTN, HLD, GERD, back pain A1C 11.2% 03/31/2020 increased from 7% 07/17/2019 Medications include:  Januvia, Toujeo 25 units q HS, Humalog 10 units tid  Patient lives with her son.  He has down's syndrome.   She is retired. She is lactose intolerant.     Diabetes Self-Management Education - 06/05/20 1339      Visit Information   Visit Type First/Initial      Initial Visit   Diabetes Type Type 2    Are you currently following a meal plan? No    Are you taking your medications as prescribed? Yes    Date Diagnosed Ashley Merritt   How would you rate your overall health? Good      Psychosocial Assessment   Patient Belief/Attitude about Diabetes Motivated to manage diabetes    Self-care barriers None    Self-management support Doctor's office    Other persons present Patient;Family Member    Patient Concerns Nutrition/Meal planning;Glycemic Control;Monitoring    Special Needs None    Preferred Learning Style No preference indicated    Learning Readiness Ready    How often do you need to have someone help you when you read instructions, pamphlets, or other written materials from your doctor or pharmacy? 1 - Never    What is the last grade level you completed in school? GED      Pre-Education Assessment   Patient understands the diabetes disease and treatment process. Needs Review    Patient understands incorporating nutritional  management into lifestyle. Needs Review    Patient undertands incorporating physical activity into lifestyle. Needs Review    Patient understands using medications safely. Needs Review    Patient understands monitoring blood glucose, interpreting and using results Needs Review    Patient understands prevention, detection, and treatment of acute complications. Needs Review    Patient understands prevention, detection, and treatment of chronic complications. Needs Review    Patient understands how to develop strategies to address psychosocial issues. Needs Review    Patient understands how to develop strategies to promote health/change behavior. Needs Review      Complications   Last HgB A1C per patient/outside source 11.2 %   03/31/20 increased from 7% 07/17/2019   How often do you check your blood sugar? 3-4 times/day    Fasting Blood glucose range (mg/dL) >200   260 this am, 96 before lunch   Number of hypoglycemic episodes per month 1    Can you tell when your blood sugar is low? Yes    What do you do if your blood sugar is low? drinks soda or juice    Number of hyperglycemic episodes per week 21    Can you tell when your blood sugar is high? No    Have you had a dilated eye exam in the past 12 months? Yes    Have you had a  dental exam in the past 12 months? Yes    Are you checking your feet? Yes    How many days per week are you checking your feet? 4      Dietary Intake   Breakfast plain instant oatmeal, splenda, fruit OR occasional bacon, egg    Lunch sandwich on [employer] bread OR fried fish, hush puppies    Snack (afternoon) nuts    Dinner baked steak, baked beans   5   Snack (evening) occasionally    Beverage(s) water, diet soda, sparkling water, coffee with splenda and sugar free powered creamer, crystal lite      Exercise   Exercise Type Light (walking / raking leaves)    How many days per week to you exercise? 1    How many minutes per day do you exercise? 30    Total minutes per  week of exercise 30      Patient Education   Previous Diabetes Education Yes (please comment)   RD at [facility] label reading, portion sizes and measuring food.;Meal options for control of blood glucose level and chronic complications.;Role of diet in the treatment of diabetes and the relationship between the three main macronutrients and blood glucose level    Physical activity and exercise  Role of exercise on diabetes management, blood pressure control and cardiac health.    Medications Reviewed patients medication for diabetes, action, purpose, timing of dose and side effects.    Acute complications Taught treatment of hypoglycemia - the 15 rule.    Chronic complications Assessed and discussed foot care and prevention of foot problems;Retinopathy and reason for yearly dilated eye exams    Psychosocial adjustment Role of stress on diabetes      Individualized Goals (developed by patient)   Nutrition General guidelines for healthy choices and portions discussed    Physical Activity Exercise 5-7 days per week;30 minutes per day    Medications take my medication as prescribed    Monitoring  test my blood glucose as discussed    Reducing Risk examine blood glucose patterns;treat hypoglycemia with 15 grams of carbs if blood glucose less than 70mg /dL;increase portions of healthy fats      Post-Education Assessment   Patient understands the diabetes disease and treatment process. Demonstrates understanding / competency    Patient understands incorporating nutritional management into lifestyle. Needs Review    Patient undertands incorporating physical activity into lifestyle. Demonstrates understanding / competency    Patient understands using medications safely. Demonstrates understanding / competency    Patient understands monitoring blood glucose, interpreting and using results Demonstrates understanding / competency    Patient understands prevention, detection, and  treatment of acute complications. Demonstrates understanding / competency    Patient understands prevention, detection, and treatment of chronic complications. Demonstrates understanding / competency    Patient understands how to develop strategies to address psychosocial issues. Demonstrates understanding / competency    Patient understands how to develop strategies to promote health/change behavior. Needs Review      Outcomes   Expected Outcomes Demonstrated interest in learning. Expect positive outcomes    Future DMSE 4-6 wks    Program Status Not Completed           Individualized Plan for Diabetes Self-Management Training:   Learning Objective:  Patient will have a greater understanding of diabetes self-management. Patient education plan is to attend individual and/or group sessions per assessed needs and concerns.   Plan:   Patient Instructions  Bake, grill or boil rather than fry most often Consider adding berries or 1/2 apple to your oatmeal  Consider adding walnuts or almonds to your oatmeal  Aim for 2-3 Carb Choices per meal (30-45 grams) +/- 1 either way  Aim for 0-1 Carbs per snack if hungry  Include protein in moderation with your meals and snacks Consider reading food labels for Total Carbohydrate of foods Consider  increasing your activity level by waling for 15 minutes daily as tolerated Continue checking BG at alternate times per day  Continue taking medication as directed by MD      Expected Outcomes:  Demonstrated interest in learning. Expect positive outcomes  Education material provided: Food label handouts, Meal plan card and My Plate  If problems or questions, patient to contact team via:  Phone  Future DSME appointment: 4-6 wks

## 2020-06-05 NOTE — Patient Instructions (Addendum)
Bake, grill or boil rather than fry most often Consider adding berries or 1/2 apple to your oatmeal  Consider adding walnuts or almonds to your oatmeal  Aim for 2-3 Carb Choices per meal (30-45 grams) +/- 1 either way  Aim for 0-1 Carbs per snack if hungry  Include protein in moderation with your meals and snacks Consider reading food labels for Total Carbohydrate of foods Consider  increasing your activity level by waling for 15 minutes daily as tolerated Continue checking BG at alternate times per day  Continue taking medication as directed by MD

## 2020-06-06 ENCOUNTER — Encounter: Payer: Self-pay | Admitting: Internal Medicine

## 2020-06-06 ENCOUNTER — Ambulatory Visit (INDEPENDENT_AMBULATORY_CARE_PROVIDER_SITE_OTHER): Payer: 59 | Admitting: Internal Medicine

## 2020-06-06 ENCOUNTER — Encounter: Payer: 59 | Admitting: Dietician

## 2020-06-06 VITALS — BP 126/78 | HR 70 | Ht 63.0 in | Wt 184.2 lb

## 2020-06-06 DIAGNOSIS — E1142 Type 2 diabetes mellitus with diabetic polyneuropathy: Secondary | ICD-10-CM

## 2020-06-06 DIAGNOSIS — E1165 Type 2 diabetes mellitus with hyperglycemia: Secondary | ICD-10-CM | POA: Diagnosis not present

## 2020-06-06 DIAGNOSIS — E119 Type 2 diabetes mellitus without complications: Secondary | ICD-10-CM | POA: Diagnosis not present

## 2020-06-06 LAB — POCT GLYCOSYLATED HEMOGLOBIN (HGB A1C): Hemoglobin A1C: 9.1 % — AB (ref 4.0–5.6)

## 2020-06-06 MED ORDER — TOUJEO SOLOSTAR 300 UNIT/ML ~~LOC~~ SOPN: 30.0000 [IU] | PEN_INJECTOR | Freq: Every day | SUBCUTANEOUS | 6 refills | Status: AC

## 2020-06-06 NOTE — Progress Notes (Addendum)
Name: Ashley Merritt  Age/ Sex: 75 y.o., female   MRN/ DOB: [id number], [birth year]     PCP: Finis Bud, NP   Reason for Endocrinology Evaluation: Type 2 Diabetes Mellitus  Initial Endocrine Consultative Visit: 07/14/2018    PATIENT IDENTIFIER: Ashley Merritt is a 75 y.o. female with a past medical history of HTN, GERD, T2DM and dyslipidemia. The patient has followed with Endocrinology clinic since 07/14/18 for consultative assistance with management of her diabetes.  DIABETIC HISTORY:  Ashley Merritt was diagnosed with T2DM in 1990 during routine workup. She was initially started on insulin but has been off for many years. She was on Metformin with GI side effects, SU, and Januvia. She went back to insulin briefly in 2014 due to hyperglycemia secondary to a back intra-articular injection. Her hemoglobin A1c has ranged from 7.1% in 2019, peaking at 8.1% in 2018.    Wilder Glade started by PCP 04/2019 but this was stopped by her visit with me in 03/2020 as well as Metformin    Insulin started in 01/2020 SUBJECTIVE:   During the last visit (05/29/2020): A1 c 11.2 % . Continued  Glipizide, Metformin, farxiga  and [place]      Today (06/06/2020): Ashley Merritt is here for a follow  up on diabetes management.  She has been checking her glucose 2  A day. The patient has not had hypoglycemic episodes since the last clinic visit.  She was approved for the [facility] and currently training    She is taking Toujeo at night and forgetting to take it so she is changing the time   Denies nausea or diarrhea   HOME DIABETES REGIMEN:  Januvia 100 mg daily  Touejo 25 units daily  Humalog  10 units TID      METER DOWNLOAD SUMMARY: 3/25-06/06/2020 Average Number Tests/Day = 1.8 Overall Mean FS Glucose = 240 Standard Deviation = 60  BG Ranges: Low = 94 High = 319    Hypoglycemic Events/30 Days: BG < 50 = 0 Episodes of symptomatic severe hypoglycemia = 0      HISTORY:  Past Medical  History:  Past Medical History:  Diagnosis Date  . Allergy   . Anemia    age 12 only   . Anxiety   . Arthritis   . Blood transfusion without reported diagnosis    with back surgery   . Cataract    bilat removed   . Diabetes mellitus without complication ([place])   . GERD (gastroesophageal reflux disease)    past hx - no meds   . Hyperlipidemia   . Hypertension    Past Surgical History:  Past Surgical History:  Procedure Laterality Date  . BUNIONECTOMY    . CATARACT EXTRACTION, BILATERAL    . COLONOSCOPY    . KNEE SURGERY    . POLYPECTOMY    . SPINE SURGERY      Social History:  reports that she has never smoked. She has never used smokeless tobacco. She reports that she does not drink alcohol and does not use drugs. Family History:  Family History  Adopted: Yes  Problem Relation Age of Onset  . Diabetes Sister   . Diabetes Brother   . Colon cancer Neg Hx   . Colon polyps Neg Hx   . Esophageal cancer Neg Hx   . Rectal cancer Neg Hx   . Stomach cancer Neg Hx      HOME MEDICATIONS: Allergies as of 06/06/2020  Reactions   Amlodipine Itching   Scalp itching and hair loss   Aspirin    Other reaction(s): Abdominal Pain   Metformin    REACTION: gi upset even 1 x 500 mg   Sertraline Hcl    REACTION: "couldn"t think"      Medication List       Accurate as of June 06, 2020 11:16 AM. If you have any questions, ask your nurse or doctor.        Accu-Chek Guide test strip Generic drug: glucose blood Use as instructed   Accu-Chek Softclix Lancets lancets Use as instructed to test blood sugar 3 times daily E11.65   atorvastatin 10 MG tablet Commonly known as: LIPITOR Take 10 mg by mouth daily.   B-D ULTRAFINE III SHORT PEN 31G X 8 MM Misc Generic drug: Insulin Pen Needle SMARTSIG:1 Each SUB-Q Daily   D2000 Ultra Strength 50 MCG (2000 UT) Caps Generic drug: Cholecalciferol Take by mouth.   Dexcom G6 Sensor Misc 1 Device by Does not apply route as  directed.   Dexcom G6 Transmitter Misc 1 Device by Does not apply route as directed.   diphenhydramine-acetaminophen 25-500 MG Tabs tablet Commonly known as: TYLENOL PM Take 1 tablet by mouth at bedtime as needed.   gabapentin 100 MG capsule Commonly known as: NEURONTIN Take 1-2 capsules (100-200 mg total) by mouth at bedtime as needed (nerve pain).   insulin lispro 100 UNIT/ML KwikPen Commonly known as: HumaLOG KwikPen Inject 10 Units into the skin 3 (three) times daily.   irbesartan-hydrochlorothiazide 300-12.5 MG tablet Commonly known as: AVALIDE irbesartan 300 mg-hydrochlorothiazide 12.5 mg tablet  TAKE 1 TABLET BY MOUTH EVERY DAY   metoprolol succinate 50 MG 24 hr tablet Commonly known as: TOPROL-XL Take 1 tablet (50 mg total) by mouth daily. Take with or immediately following a meal.   Polyethyl Glycol-Propyl Glycol 0.4-0.3 % Soln Apply to eye.   sitaGLIPtin 100 MG tablet Commonly known as: JANUVIA Take 100 mg by mouth daily.   Toujeo SoloStar 300 UNIT/ML Solostar Pen Generic drug: insulin glargine (1 Unit Dial) Inject 25 Units into the skin daily at 2 PM.   traZODone 50 MG tablet Commonly known as: DESYREL Take 50 mg by mouth at bedtime.        OBJECTIVE:   Vital Signs: BP 126/78   Pulse 70   Ht 5\' 3"  (1.6 m)   Wt 184 lb 4 oz (83.6 kg)   SpO2 98%   BMI 32.64 kg/m   Wt Readings from Last 3 Encounters:  06/06/20 184 lb 4 oz (83.6 kg)  05/28/20 184 lb (83.5 kg)  03/31/20 178 lb 2 oz (80.8 kg)     Exam: General: Pt appears well and is in NAD  Lungs: Clear with good BS bilat   Heart: RRR   Abdomen: Normoactive bowel sounds, soft, nontender, without masses or organomegaly palpable  Extremities: No pretibial edema.   Neuro: MS is good with appropriate affect, pt is alert and Ox3       DM foot exam: 06/06/2020 The skin of the feet is intact without sores or ulcerations. The pedal pulses are 2+ on right and 2+ on left. The sensation is intact  to  a screening 5.07, 10 gram monofilament bilaterally         DATA REVIEWED:  Lab Results  Component Value Date   HGBA1C 9.1 (A) 06/06/2020   HGBA1C 11.2 (A) 03/31/2020   HGBA1C 7.0 (A) 07/17/2019   Lab Results  Component Value Date   MICROALBUR <0.7 08/14/2019   LDLCALC 41 08/14/2019   CREATININE 1.26 (H) 12/13/2019   Lab Results  Component Value Date   MICRALBCREAT 2.1 08/14/2019     Lab Results  Component Value Date   CHOL 122 08/14/2019   HDL 69.30 08/14/2019   LDLCALC 41 08/14/2019   TRIG 60.0 08/14/2019   CHOLHDL 2 08/14/2019         ASSESSMENT / PLAN / RECOMMENDATIONS:   1) Type 2 Diabetes Mellitus, Poorly  controlled, With neuropathic complications - Most recent A1c of 9.1 %. Goal A1c < 7.0 %.    - A1c down from 11.1 %  - She is in the process of getting trained for Dexcom - She has been noted with fasting hyperglycemia, will increase Toujeo as below   MEDICATIONS:   Continue Januvia 100 mg daily   Increase  Toujeo 30 units daily   Continue  Humalog to 10  units with each meal   EDUCATION / INSTRUCTIONS:  BG monitoring instructions: Patient is instructed to check her blood sugars 2 times a day, Before breakfast and supper time .  Call [facility] if: BG persistently < 70  . I reviewed the Rule of 15 for the treatment of hypoglycemia in detail with the patient. Literature supplied.   F/U in 2 months   Signed electronically by: Mack Guise, MD  [facility]  [address] [place], [place] [address] Phone: [phone] FAX: [phone]   CC: Finis Bud, NP [address] [place] [address] Phone: [phone]  Fax: [phone]  Return to Endocrinology clinic as below: Future Appointments  Date Time Provider [place]  06/06/2020 12:00 PM Clydell Hakim, RD [place] [facility]  06/25/2020  1:45 PM Gregor Hams, MD [facility] None  07/15/2020   2:45 PM Gardiner Barefoot, DPM [facility]  07/24/2020  1:45 PM Valora Piccolo, Barnabas Lister, RD [place] [facility]

## 2020-06-06 NOTE — Patient Instructions (Signed)
-   Continue Januvia 100 mg daily  - Increase  Toujeo to  30 units daily  - Continue  Humalog 10 units with each meal         HOW TO TREAT LOW BLOOD SUGARS (Blood sugar LESS THAN 70 MG/DL)  Please follow the RULE OF 15 for the treatment of hypoglycemia treatment (when your (blood sugars are less than 70 mg/dL)    STEP 1: Take 15 grams of carbohydrates when your blood sugar is low, which includes:   3-4 GLUCOSE TABS  OR  3-4 OZ OF JUICE OR REGULAR SODA OR  ONE TUBE OF GLUCOSE GEL     STEP 2: RECHECK blood sugar in 15 MINUTES STEP 3: If your blood sugar is still low at the 15 minute recheck --> then, go back to STEP 1 and treat AGAIN with another 15 grams of carbohydrates.

## 2020-06-11 NOTE — Progress Notes (Signed)
Dexcom G6 Personal CGM Training  Start time:1230    End time: 1300 Total time: 30 minutes Ashley Merritt was educated about the following:  -Getting to know device    IT trainer) -Setting up device (high alert  250  , low alert 80  ) -Setting alert profile -Inserting sensor  -Calibrating- none required for G6 -Ending sensor session -Trouble shooting -Tape guide, clarity information  -Reviewed insulin dosing from dexcom.   Patient has [facility] tech support and my contact information.

## 2020-06-16 ENCOUNTER — Telehealth: Payer: Self-pay | Admitting: Family Medicine

## 2020-06-16 DIAGNOSIS — M25562 Pain in left knee: Secondary | ICD-10-CM

## 2020-06-16 DIAGNOSIS — M25561 Pain in right knee: Secondary | ICD-10-CM

## 2020-06-16 NOTE — Telephone Encounter (Signed)
Patient called to let Dr Georgina Snell know that when she was referred to [facility], she was told that they would not be able to see her due to their availability. She was then referred to [facility]. They told her that because she could walk on her own and was able to drive, she would not be qualified for in home therapy.

## 2020-06-17 NOTE — Telephone Encounter (Signed)
Would she like to try outpatient therapy?

## 2020-06-18 ENCOUNTER — Other Ambulatory Visit: Payer: Self-pay | Admitting: Physical Therapy

## 2020-06-18 NOTE — Telephone Encounter (Signed)
Yes, please place outpatient PT referral to [facility].  Pt lives near [place] so this and [place] would be closest options.

## 2020-06-19 NOTE — Telephone Encounter (Signed)
Referral to physical therapy at [facility] ordered today.

## 2020-06-19 NOTE — Addendum Note (Signed)
Addended by: Gregor Hams on: 06/19/2020 07:43 AM   Modules accepted: Orders

## 2020-06-24 NOTE — Progress Notes (Deleted)
   I, Peterson Lombard, LAT, ATC acting as a scribe for Lynne Leader, MD.  Ashley Merritt is a 75 y.o. female who presents to [place] at [facility] today for f/u bilat knee and R hip pain, and R-sided lumbar radiculopathy. Pt was last seen by Dr. Georgina Snell on 05/28/20 and was given a L knee steroid injection, prescribed gabapentin, and was referred to [facility] PT of which she's completed # visits. Today, pt reports  Dx imaging: 05/28/20 R & L knee XR  03/04/18 L-spine MRI  Pertinent review of systems: ***  Relevant historical information: ***   Exam:  There were no vitals taken for this visit. General: Well Developed, well nourished, and in no acute distress.   MSK: ***    Lab and Radiology Results No results found for this or any previous visit (from the past 72 hour(s)). No results found.     Assessment and Plan: 75 y.o. female with ***   PDMP not reviewed this encounter. No orders of the defined types were placed in this encounter.  No orders of the defined types were placed in this encounter.    Discussed warning signs or symptoms. Please see discharge instructions. Patient expresses understanding.   ***

## 2020-06-25 ENCOUNTER — Ambulatory Visit: Payer: 59 | Admitting: Family Medicine

## 2020-06-27 ENCOUNTER — Ambulatory Visit: Payer: 59 | Admitting: Rehabilitative and Restorative Service Providers"

## 2020-06-30 ENCOUNTER — Ambulatory Visit: Payer: 59 | Admitting: Physical Therapy

## 2020-06-30 NOTE — Progress Notes (Signed)
Rito Ehrlich, am serving as a Education administrator for Dr. Lynne Leader.  Ashley Merritt is a 75 y.o. female who presents to [place] at [facility] today for f/u of B knee pain, R >L, and LBP w/ radiating pain into her R LE to the calf.  She was last seen by Dr. Georgina Snell on 05/28/20 and had a L knee injection.  She was also referred for home health PT for her knees and her low back and was prescribed Gabapentin.  Since her last visit, pt reports that the pain has lightened up but still bothering her. R knee is the worst it gets swollen. States at the store her L hip had a shooting pain that made her holler. Patient states she used the aspercream and tylenol to help with pain. Patient states that she was unable to get home health because she was able to walk and drive but is having trouble getting to an external PT place because she has to get a ride. Patient also states that she is moving so she would have to get PT around where she is moving.   Patient will be moving [place] [facility] [place] area next [place]  Diagnostic testing: B knee XR- 05/28/20   Pertinent review of systems: No fevers or chills  Relevant historical information: Diabetes   Exam:  BP 138/82 (BP Location: Left Arm, Patient Position: Sitting, Cuff Size: Normal)   Pulse 61   Ht 5\' 3"  (1.6 m)   Wt 187 lb (84.8 kg)   SpO2 97%   BMI 33.13 kg/m  General: Well Developed, well nourished, and in no acute distress.   MSK: Right knee mild effusion normal motion with crepitation.   Left knee minimal effusion normal motion with crepitation.   Hips normal motion.  Normal gait.   Lab and Radiology Results  EXAM: RIGHT KNEE 3 VIEWS  COMPARISON:  None.  FINDINGS: No evidence of fracture, dislocation, or joint effusion. Mild narrowing and osteophyte formation is seen involving the medial and lateral joint spaces with associated chondrocalcinosis. Moderate narrowing of patellofemoral space is noted. Soft tissues  are unremarkable.  IMPRESSION: Mild-to-moderate degenerative joint disease is noted. No acute abnormality is noted.   Electronically Signed   By: Marijo Conception M.D.   On: 05/29/2020 10:18  EXAM: LEFT KNEE 3 VIEWS  COMPARISON:  None.  FINDINGS: No evidence of fracture, dislocation, or joint effusion. Minimal osteophyte formation is noted medially. Chondrocalcinosis is noted laterally with minimal joint space narrowing. Mild patellar spurring is noted. Soft tissues are unremarkable.  IMPRESSION: Minimal to mild degenerative joint disease. No acute abnormality seen.   Electronically Signed   By: Marijo Conception M.D.   On: 05/29/2020 10:20  I, Lynne Leader, personally (independently) visualized and performed the interpretation of the images attached in this note.    Assessment and Plan: 75 y.o. female with bilateral knee pain due to DJD.  She had a right knee injection in March 30 and unfortunately still having a fair amount of pain.  She is a good candidate for physical therapy but we are unable to arrange for home health physical therapy after the last visit.  She should qualify because although she can drive if she does not own a car.  Her entire living situation is about the change however.  She is moving to the [place] area next week and will have access to a car down there.  She already has selected a physical  therapy location where she would like to go ([facility] at [place]).  And would like a referral there.  This is a obvious good solution.  Go ahead and place referral now for physical therapy.  Additionally she is a good candidate for trial of hyaluronic acid injections.  We will go ahead and authorize single dose hyaluronic acid injection series and try to get this done at some point in the near future here.  It makes sense for her to transition her care to a primary care and sports medicine/orthopedics provider near her to where she lives  however I am happy to provide ongoing care should she desire to drive all the way back up to [facility] to see me.  Additionally x-rays did show evidence of chondrocalcinosis.  She may benefit from colchicine to try to treat pseudogout.   Recheck   PDMP not reviewed this encounter. Orders Placed This Encounter  Procedures  . Ambulatory referral to Physical Therapy    Referral Priority:   Routine    Referral Type:   Physical Medicine    Referral Reason:   Specialty Services Required    Requested Specialty:   Physical Therapy    Number of Visits Requested:   1   Meds ordered this encounter  Medications  . colchicine 0.6 MG tablet    Sig: Take 1 tablet (0.6 mg total) by mouth daily as needed (gout or psuedogout pain).    Dispense:  30 tablet    Refill:  2     Discussed warning signs or symptoms. Please see discharge instructions. Patient expresses understanding.   The above documentation has been reviewed and is accurate and complete Lynne Leader, M.D.   Total encounter time 30 minutes including face-to-face time with the patient and, reviewing past medical record, and charting on the date of service.   Extensive discussion regarding treatment plan and options going forward.

## 2020-07-01 ENCOUNTER — Encounter: Payer: Self-pay | Admitting: Family Medicine

## 2020-07-01 ENCOUNTER — Ambulatory Visit (INDEPENDENT_AMBULATORY_CARE_PROVIDER_SITE_OTHER): Payer: 59 | Admitting: Family Medicine

## 2020-07-01 ENCOUNTER — Other Ambulatory Visit: Payer: Self-pay

## 2020-07-01 VITALS — BP 138/82 | HR 61 | Ht 63.0 in | Wt 187.0 lb

## 2020-07-01 DIAGNOSIS — M25561 Pain in right knee: Secondary | ICD-10-CM

## 2020-07-01 DIAGNOSIS — M5441 Lumbago with sciatica, right side: Secondary | ICD-10-CM | POA: Diagnosis not present

## 2020-07-01 DIAGNOSIS — G8929 Other chronic pain: Secondary | ICD-10-CM

## 2020-07-01 DIAGNOSIS — M7061 Trochanteric bursitis, right hip: Secondary | ICD-10-CM

## 2020-07-01 DIAGNOSIS — M25562 Pain in left knee: Secondary | ICD-10-CM

## 2020-07-01 MED ORDER — COLCHICINE 0.6 MG PO TABS: 0.6000 mg | ORAL_TABLET | Freq: Every day | ORAL | 2 refills | Status: DC | PRN

## 2020-07-01 NOTE — Patient Instructions (Addendum)
Thank you for coming in today.  I've referred you to Physical Therapy.  Let us know if you don't hear from them in one week.  I can get the gel shots approved for both knees but it will take a week or two.   It makes sense to get established down in the [place].

## 2020-07-13 ENCOUNTER — Other Ambulatory Visit: Payer: Self-pay | Admitting: Family Medicine

## 2020-07-14 NOTE — Telephone Encounter (Signed)
Rx refill request approved per Dr. Corey's orders. 

## 2020-07-15 ENCOUNTER — Ambulatory Visit: Payer: 59 | Admitting: Podiatry

## 2020-07-24 ENCOUNTER — Encounter: Payer: Self-pay | Admitting: Dietician

## 2020-07-24 ENCOUNTER — Encounter: Payer: 59 | Attending: Internal Medicine | Admitting: Dietician

## 2020-07-24 DIAGNOSIS — E119 Type 2 diabetes mellitus without complications: Secondary | ICD-10-CM | POA: Insufficient documentation

## 2020-07-24 NOTE — Progress Notes (Signed)
Diabetes Self-Management Education  Visit Type: Follow-up  Appt. Start Time: 1115 Appt. End Time: 1150  07/24/2020  Ashley Merritt Neighbor, identified by name and date of birth, is a 75 y.o. female with a diagnosis of Diabetes:  .   ASSESSMENT This is a phone visit.  Patient is at her home and I am at my home office.  Patient states that she is moving to [facility] on 07/26/20 and will need to establish new MD's there.   She continues to use the Dexcom G6 without problems.  She notes that her sensor readings in the am are high (particularly when she snacks at night) - about 190 but decreases to 115-145 before lunch. She reports consistency with her medications. Hypoglycemia to 70 once recently and treated with small amount of juice. Noted A1C 9.1% 06/06/2020 decreased from 11.2% 03/31/2020  She has increased knee pain with walking.  History includes:  Type 2 Diabetes, HTN, HLD, GERD, back pain A1C 9.1% 06/06/2020, 11.2% 03/31/2020 increased from 7% 07/17/2019 Medications include:  Januvia, Toujeo 25 units q HS, Humalog 10 units tid  Patient lives with her son.  He has down's syndrome and prediabetes. She is retired. She is lactose intolerant.     Diabetes Self-Management Education - 07/24/20 1506      Visit Information   Visit Type Follow-up      Initial Visit   Are you taking your medications as prescribed? Yes      Pre-Education Assessment   Patient understands the diabetes disease and treatment process. Demonstrates understanding / competency    Patient understands incorporating nutritional management into lifestyle. Needs Review    Patient undertands incorporating physical activity into lifestyle. Needs Review    Patient understands using medications safely. Needs Review    Patient understands monitoring blood glucose, interpreting and using results Needs Review    Patient understands prevention, detection, and treatment of acute complications. Needs Review    Patient  understands prevention, detection, and treatment of chronic complications. Demonstrates understanding / competency    Patient understands how to develop strategies to address psychosocial issues. Demonstrates understanding / competency    Patient understands how to develop strategies to promote health/change behavior. Needs Review      Complications   Last HgB A1C per patient/outside source 9.1 %    How often do you check your blood sugar? > 4 times/day   Dexcom G6   Fasting Blood glucose range (mg/dL) 180-200    Postprandial Blood glucose range (mg/dL) 70-129;130-179    Number of hypoglycemic episodes per month 1    Can you tell when your blood sugar is low? Yes    What do you do if your blood sugar is low? drink juice      Dietary Intake   Breakfast bacon, 1 sausage,  eggs,, 1 waffle with sugar free syrup    Lunch soup OR tuna with crackers or toast    Dinner fish sticks or chicken, brown rice or sweet potato, broccoli or green beans    Snack (evening) occasional tortilla chips    Beverage(s) water, diet soda, lite juice, sparkling water, coffee with splenda and sugar free powdered creamer      Exercise   Exercise Type Light (walking / raking leaves)    How many days per week to you exercise? 1    How many minutes per day do you exercise? 30    Total minutes per week of exercise 30      Patient  Education   Previous Diabetes Education Yes (please comment)   05/2020   Nutrition management  Meal options for control of blood glucose level and chronic complications.    Physical activity and exercise  Helped patient identify appropriate exercises in relation to his/her diabetes, diabetes complications and other health issue.    Medications Reviewed patients medication for diabetes, action, purpose, timing of dose and side effects.    Monitoring Identified appropriate SMBG and/or A1C goals.    Acute complications Taught treatment of hypoglycemia - the 15 rule.    Psychosocial adjustment  Identified and addressed patients feelings and concerns about diabetes      Individualized Goals (developed by patient)   Nutrition General guidelines for healthy choices and portions discussed    Physical Activity Exercise 3-5 times per week;30 minutes per day    Medications take my medication as prescribed    Monitoring  test my blood glucose as discussed    Reducing Risk increase portions of healthy fats;examine blood glucose patterns;do foot checks daily      Patient Self-Evaluation of Goals - Patient rates self as meeting previously set goals (% of time)   Nutrition >75%    Physical Activity < 25%    Medications >75%    Monitoring >75%    Problem Solving >75%    Reducing Risk >75%    Health Coping >75%      Post-Education Assessment   Patient understands the diabetes disease and treatment process. Demonstrates understanding / competency    Patient understands incorporating nutritional management into lifestyle. Demonstrates understanding / competency    Patient undertands incorporating physical activity into lifestyle. Demonstrates understanding / competency    Patient understands using medications safely. Demonstrates understanding / competency    Patient understands monitoring blood glucose, interpreting and using results Demonstrates understanding / competency    Patient understands prevention, detection, and treatment of acute complications. Demonstrates understanding / competency    Patient understands prevention, detection, and treatment of chronic complications. Demonstrates understanding / competency    Patient understands how to develop strategies to address psychosocial issues. Demonstrates understanding / competency    Patient understands how to develop strategies to promote health/change behavior. Demonstrates understanding / competency      Outcomes   Expected Outcomes Demonstrated interest in learning. Expect positive outcomes    Future DMSE PRN    Program Status  Completed      Subsequent Visit   Since your last visit have you continued or begun to take your medications as prescribed? Yes    Since your last visit, are you checking your blood glucose at least once a day? Yes           Individualized Plan for Diabetes Self-Management Training:   Learning Objective:  Patient will have a greater understanding of diabetes self-management. Patient education plan is to attend individual and/or group sessions per assessed needs and concerns.   Plan:   Patient Instructions  Continue to be mindful about the fat content of meals.    Remove chicken skin  Bake rather than fry   Add only small amounts of oil or margarine or avoid  Google for new recipes such as baked fish  Choose raw vegetables or sugar free jello or sugar free popsickles for snacks.  It is best if snacks have less than 10 grams of carbohydrates per serving.  Avoid snacking if not hungry.  Be mindful of your beverage intake.  Overall beverages should have no carbohydrates.  Sugar (  evan natural from juice) can make your blood sugar harder to control.  Consider exercise options that are easier on your knees  Google "you tube arm chair exercises"  Google "you tube sit and be fit"  [employer] pool  Aim for 30 minutes of exercise most days or a total of 150 minutes spread out through the week.  Continue use of your Dexcom.  Continue to see of the effects of food, exercise, and medication on your blood sugar.  Continue to take your medication as prescribed.    Expected Outcomes:  Demonstrated interest in learning. Expect positive outcomes  Education material provided:   If problems or questions, patient to contact team via:  Phone  Future DSME appointment: PRN

## 2020-07-24 NOTE — Patient Instructions (Addendum)
Continue to be mindful about the fat content of meals.    Remove chicken skin  Bake rather than fry   Add only small amounts of oil or margarine or avoid  Google for new recipes such as baked fish  Choose raw vegetables or sugar free jello or sugar free popsickles for snacks.  It is best if snacks have less than 10 grams of carbohydrates per serving.  Avoid snacking if not hungry.  Be mindful of your beverage intake.  Overall beverages should have no carbohydrates.  Sugar (evan natural from juice) can make your blood sugar harder to control.  Consider exercise options that are easier on your knees  Google "you tube arm chair exercises"  Google "you tube sit and be fit"  [employer] pool  Aim for 30 minutes of exercise most days or a total of 150 minutes spread out through the week.  Continue use of your Dexcom.  Continue to see of the effects of food, exercise, and medication on your blood sugar.  Continue to take your medication as prescribed.

## 2020-10-17 ENCOUNTER — Ambulatory Visit: Payer: 59 | Admitting: Internal Medicine

## 2020-10-17 NOTE — Progress Notes (Deleted)
Name: Ashley Merritt  Age/ Sex: 75 y.o., female   MRN/ DOB: [id number], 04/20/1945     PCP: Finis Bud, NP   Reason for Endocrinology Evaluation: Type 2 Diabetes Mellitus  Initial Endocrine Consultative Visit: 07/14/2018    PATIENT IDENTIFIER: Ashley Merritt is a 75 y.o. female with a past medical history of HTN, GERD, T2DM and dyslipidemia. The patient has followed with Endocrinology clinic since 07/14/18 for consultative assistance with management of her diabetes.  DIABETIC HISTORY:  [name] was diagnosed with T2DM in 1990 during routine workup. She was initially started on insulin but has been off for many years. She was on Metformin with GI side effects, SU, and Januvia. She went back to insulin briefly in 2014 due to hyperglycemia secondary to a back intra-articular injection. Her hemoglobin A1c has ranged from 7.1% in 2019, peaking at 8.1% in 2018.    Wilder Glade started by PCP 04/2019 but this was stopped by her visit with me in 03/2020 as well as Metformin    Insulin started in 01/2020 SUBJECTIVE:   During the last visit (05/29/2020): A1 c 11.2 % . Continued  Glipizide, Metformin, farxiga  and [place]      Today (10/17/2020): Ms. Stores is here for a follow  up on diabetes management.  She has been checking her glucose 2  A day. The patient has not had hypoglycemic episodes since the last clinic visit.  She was approved for the [facility] and currently training    She is taking Toujeo at night and forgetting to take it so she is changing the time   Denies nausea or diarrhea      HOME DIABETES REGIMEN:  Januvia 100 mg daily  Toujeo 30 units daily  Humalog to 10  units with each meal     METER DOWNLOAD SUMMARY: 3/25-06/06/2020 Average Number Tests/Day = 1.8 Overall Mean FS Glucose = 240 Standard Deviation = 60  BG Ranges: Low = 94 High = 319    Hypoglycemic Events/30 Days: BG < 50 = 0 Episodes of symptomatic severe hypoglycemia = 0       DIABETIC COMPLICATIONS: Microvascular complications:  ? Retinopathy in 03/2017 per endo note but pt unaware Denies: neuropathy, CKD Last eye exam: Completed 03/2017   Macrovascular complications:    Denies: CAD, PVD, CVA    HISTORY:  Past Medical History:  Past Medical History:  Diagnosis Date   Allergy    Anemia    age 40 only    Anxiety    Arthritis    Blood transfusion without reported diagnosis    with back surgery    Cataract    bilat removed    Diabetes mellitus without complication ([place])    GERD (gastroesophageal reflux disease)    past hx - no meds    Hyperlipidemia    Hypertension    Past Surgical History:  Past Surgical History:  Procedure Laterality Date   BUNIONECTOMY     CATARACT EXTRACTION, BILATERAL     COLONOSCOPY     KNEE SURGERY     POLYPECTOMY     SPINE SURGERY     Social History:  reports that she has never smoked. She has never used smokeless tobacco. She reports that she does not drink alcohol and does not use drugs. Family History:  Family History  Adopted: Yes  Problem Relation Age of Onset   Diabetes Sister    Diabetes Brother    Colon cancer Neg Hx  Colon polyps Neg Hx    Esophageal cancer Neg Hx    Rectal cancer Neg Hx    Stomach cancer Neg Hx      HOME MEDICATIONS: Allergies as of 10/17/2020       Reactions   Amlodipine Itching   Scalp itching and hair loss   Aspirin    Other reaction(s): Abdominal Pain   Metformin    REACTION: gi upset even 1 x 500 mg   Sertraline Hcl    REACTION: "couldn"t think"        Medication List        Accurate as of October 17, 2020  8:03 AM. If you have any questions, ask your nurse or doctor.          Accu-Chek Guide test strip Generic drug: glucose blood Use as instructed   Accu-Chek Softclix Lancets lancets Use as instructed to test blood sugar 3 times daily E11.65   atorvastatin 10 MG tablet Commonly known as: LIPITOR Take 10 mg by mouth daily.   B-D ULTRAFINE III  SHORT PEN 31G X 8 MM Misc Generic drug: Insulin Pen Needle SMARTSIG:1 Each SUB-Q Daily   colchicine 0.6 MG tablet TAKE 1 TABLET (0.6 MG TOTAL) BY MOUTH DAILY AS NEEDED (GOUT OR PSUEDOGOUT PAIN).   D2000 Ultra Strength 50 MCG (2000 UT) Caps Generic drug: Cholecalciferol Take by mouth.   Dexcom G6 Sensor Misc 1 Device by Does not apply route as directed.   Dexcom G6 Transmitter Misc 1 Device by Does not apply route as directed.   diphenhydramine-acetaminophen 25-500 MG Tabs tablet Commonly known as: TYLENOL PM Take 1 tablet by mouth at bedtime as needed.   gabapentin 100 MG capsule Commonly known as: NEURONTIN Take 1-2 capsules (100-200 mg total) by mouth at bedtime as needed (nerve pain).   insulin lispro 100 UNIT/ML KwikPen Commonly known as: HumaLOG KwikPen Inject 10 Units into the skin 3 (three) times daily.   irbesartan-hydrochlorothiazide 300-12.5 MG tablet Commonly known as: AVALIDE irbesartan 300 mg-hydrochlorothiazide 12.5 mg tablet  TAKE 1 TABLET BY MOUTH EVERY DAY   metoprolol succinate 50 MG 24 hr tablet Commonly known as: TOPROL-XL Take 1 tablet (50 mg total) by mouth daily. Take with or immediately following a meal.   multivitamin with minerals Tabs tablet Take 1 tablet by mouth daily.   Polyethyl Glycol-Propyl Glycol 0.4-0.3 % Soln Apply to eye.   sitaGLIPtin 100 MG tablet Commonly known as: JANUVIA Take 100 mg by mouth daily.   Toujeo SoloStar 300 UNIT/ML Solostar Pen Generic drug: insulin glargine (1 Unit Dial) Inject 30 Units into the skin daily at 2 PM.         OBJECTIVE:   Vital Signs: There were no vitals taken for this visit.  Wt Readings from Last 3 Encounters:  07/01/20 187 lb (84.8 kg)  06/06/20 184 lb 4 oz (83.6 kg)  05/28/20 184 lb (83.5 kg)     Exam: General: Pt appears well and is in NAD  Lungs: Clear with good BS bilat   Heart: RRR   Abdomen: Normoactive bowel sounds, soft, nontender, without masses or organomegaly  palpable  Extremities: No pretibial edema.   Neuro: MS is good with appropriate affect, pt is alert and Ox3       DM foot exam: 06/06/2020 The skin of the feet is intact without sores or ulcerations. The pedal pulses are 2+ on right and 2+ on left. The sensation is intact  to a screening 5.07, 10 gram monofilament bilaterally  DATA REVIEWED:  Lab Results  Component Value Date   HGBA1C 9.1 (A) 06/06/2020   HGBA1C 11.2 (A) 03/31/2020   HGBA1C 7.0 (A) 07/17/2019   Lab Results  Component Value Date   MICROALBUR <0.7 08/14/2019   LDLCALC 41 08/14/2019   CREATININE 1.26 (H) 12/13/2019   Lab Results  Component Value Date   MICRALBCREAT 2.1 08/14/2019     Lab Results  Component Value Date   CHOL 122 08/14/2019   HDL 69.30 08/14/2019   LDLCALC 41 08/14/2019   TRIG 60.0 08/14/2019   CHOLHDL 2 08/14/2019         ASSESSMENT / PLAN / RECOMMENDATIONS:   1) Type 2 Diabetes Mellitus, Poorly  controlled, With neuropathic complications - Most recent A1c of 9.1 %. Goal A1c < 7.0 %.    - A1c down from 11.1 %  - She is in the process of getting trained for Dexcom - She has been noted with fasting hyperglycemia, will increase Toujeo as below   MEDICATIONS:  Continue Januvia 100 mg daily  Increase  Toujeo 30 units daily  Continue  Humalog to 10  units with each meal   EDUCATION / INSTRUCTIONS: BG monitoring instructions: Patient is instructed to check her blood sugars 2 times a day, Before breakfast and supper time . Call [facility] if: BG persistently < 70  I reviewed the Rule of 15 for the treatment of hypoglycemia in detail with the patient. Literature supplied.   F/U in 2 months   Signed electronically by: Mack Guise, MD  [facility]  [facility] [address] [place], [place] [address] Phone: [phone] FAX: [phone]   CC: Finis Bud, NP [address] [place]  [place] [address] Phone: [phone]  Fax: [phone]  Return to Endocrinology clinic as below: Future Appointments  Date Time Provider [place]  10/17/2020 11:10 AM Deovion Batrez, Melanie Crazier, MD [facility] None

## 2022-07-01 ENCOUNTER — Encounter: Payer: Self-pay | Admitting: Gastroenterology

## 2022-09-11 IMAGING — DX DG KNEE AP/LAT W/ SUNRISE*L*
3 series · 3 of 3 positions shown · non-contrast
Comparison: None.

CLINICAL DATA: Bilateral knee pain for several months.

EXAM:
LEFT KNEE 3 VIEWS

[knee ap]
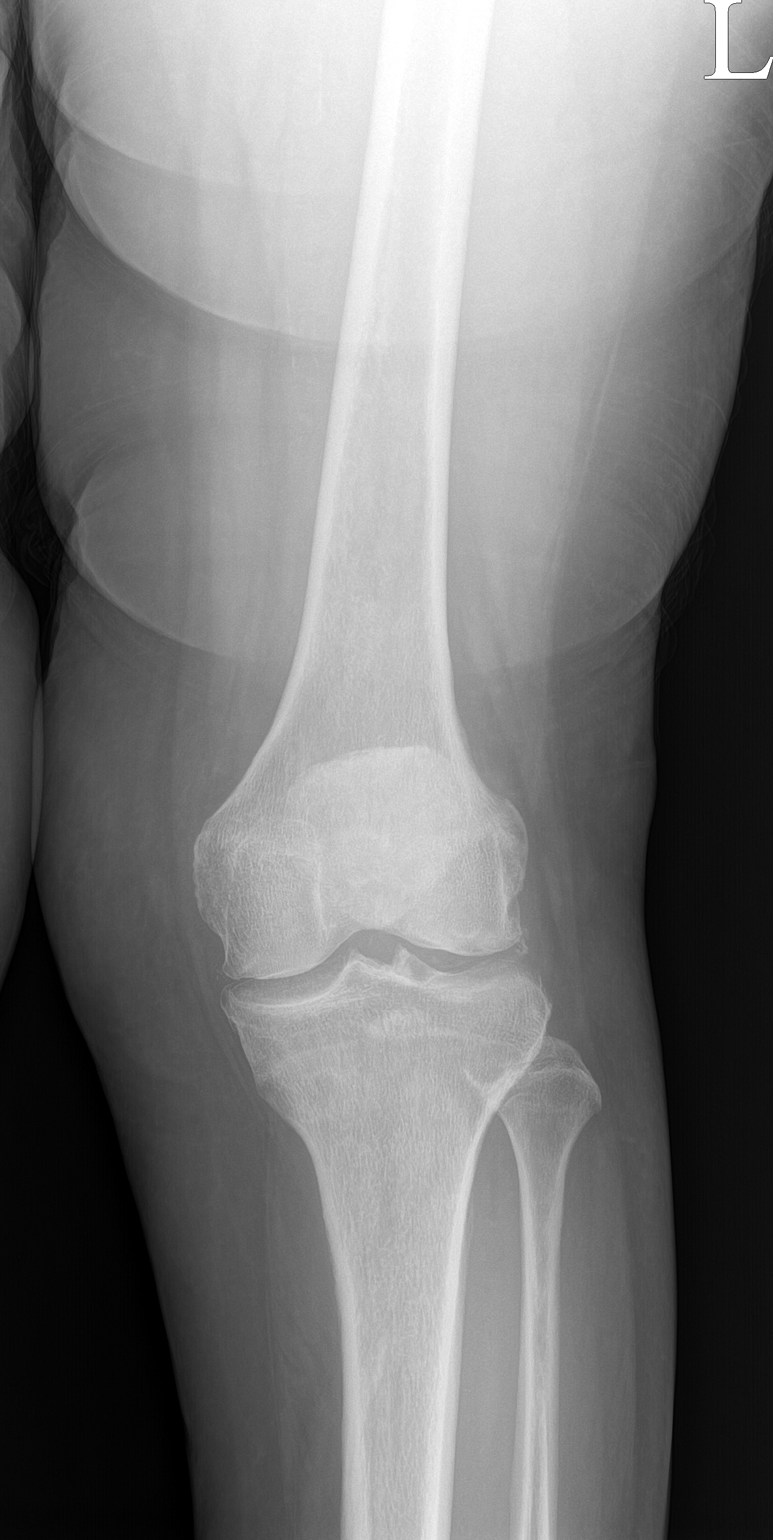

[knee lat]
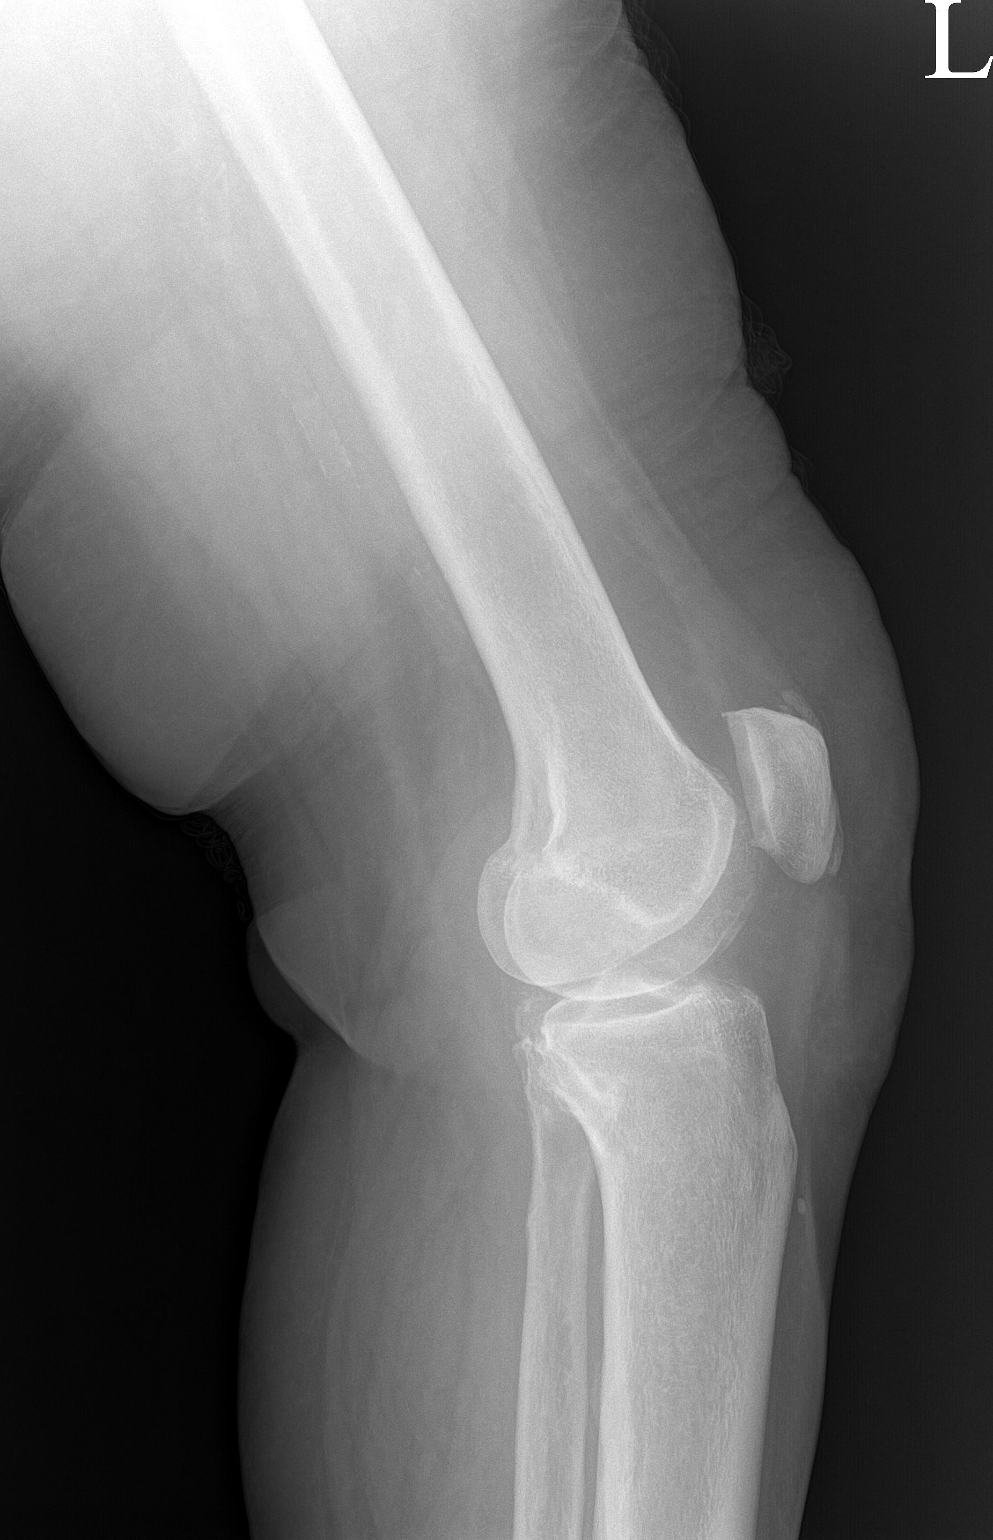

[patella]
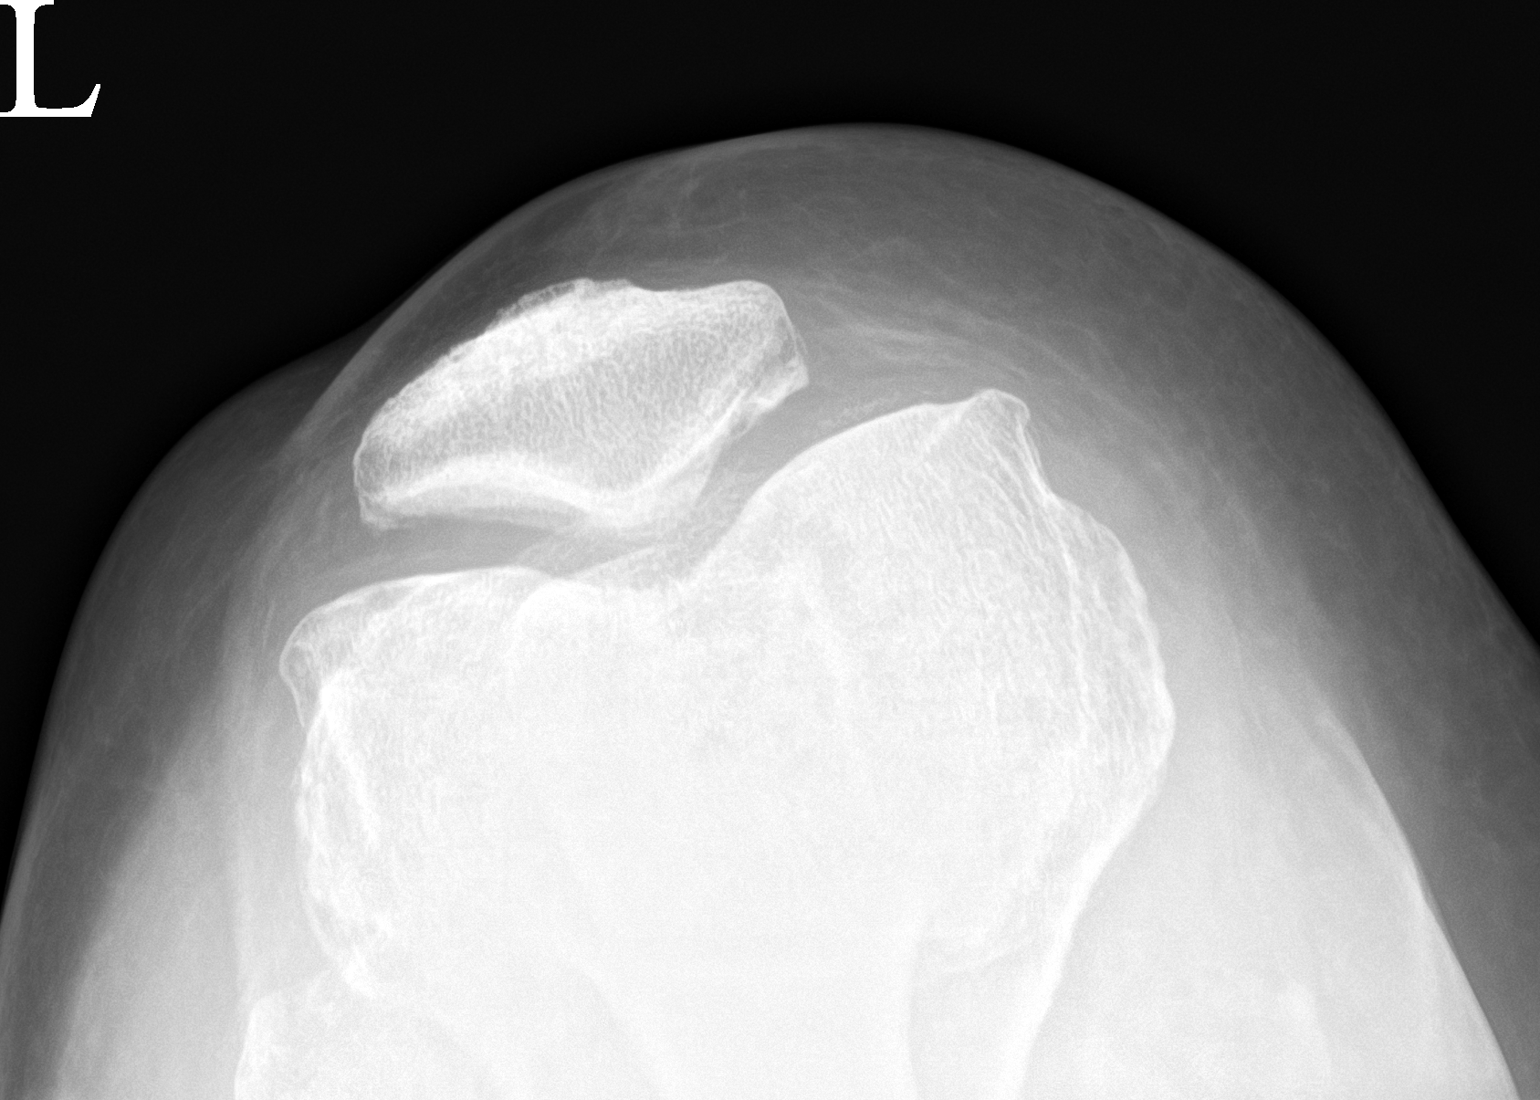

[3 of 3 positions shown; findings below may reference images not displayed]

FINDINGS: No evidence of fracture, dislocation, or joint effusion. Minimal
osteophyte formation is noted medially. Chondrocalcinosis is noted
laterally with minimal joint space narrowing. Mild patellar spurring
is noted. Soft tissues are unremarkable.
IMPRESSION: Minimal to mild degenerative joint disease. No acute abnormality
seen.

## 2022-09-11 IMAGING — DX DG KNEE AP/LAT W/ SUNRISE*R*
3 series · 3 of 3 positions shown · non-contrast
Comparison: None.

CLINICAL DATA: Bilateral knee pain for several months.

EXAM:
RIGHT KNEE 3 VIEWS

[knee ap]
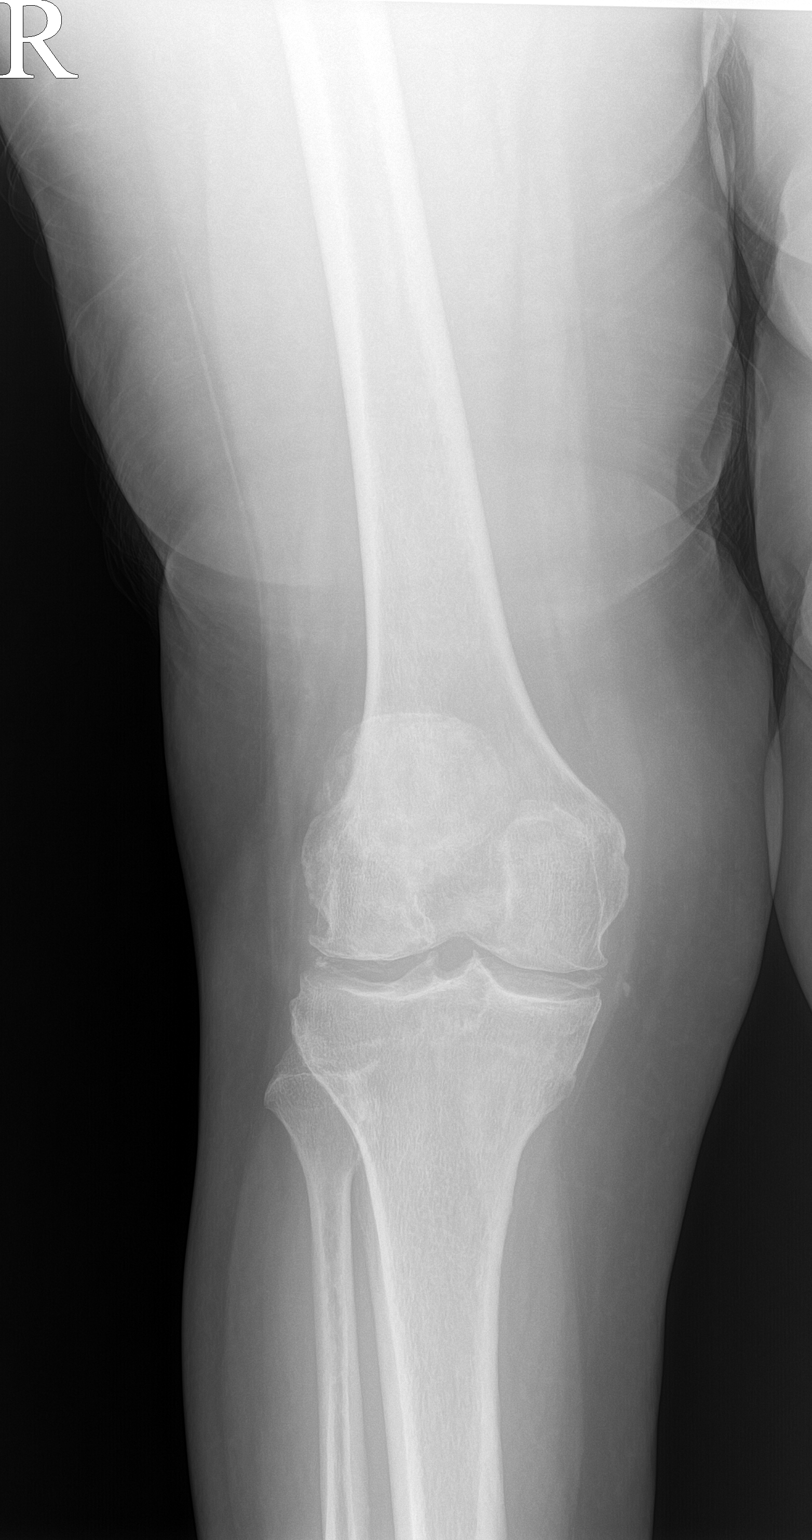

[knee lat]
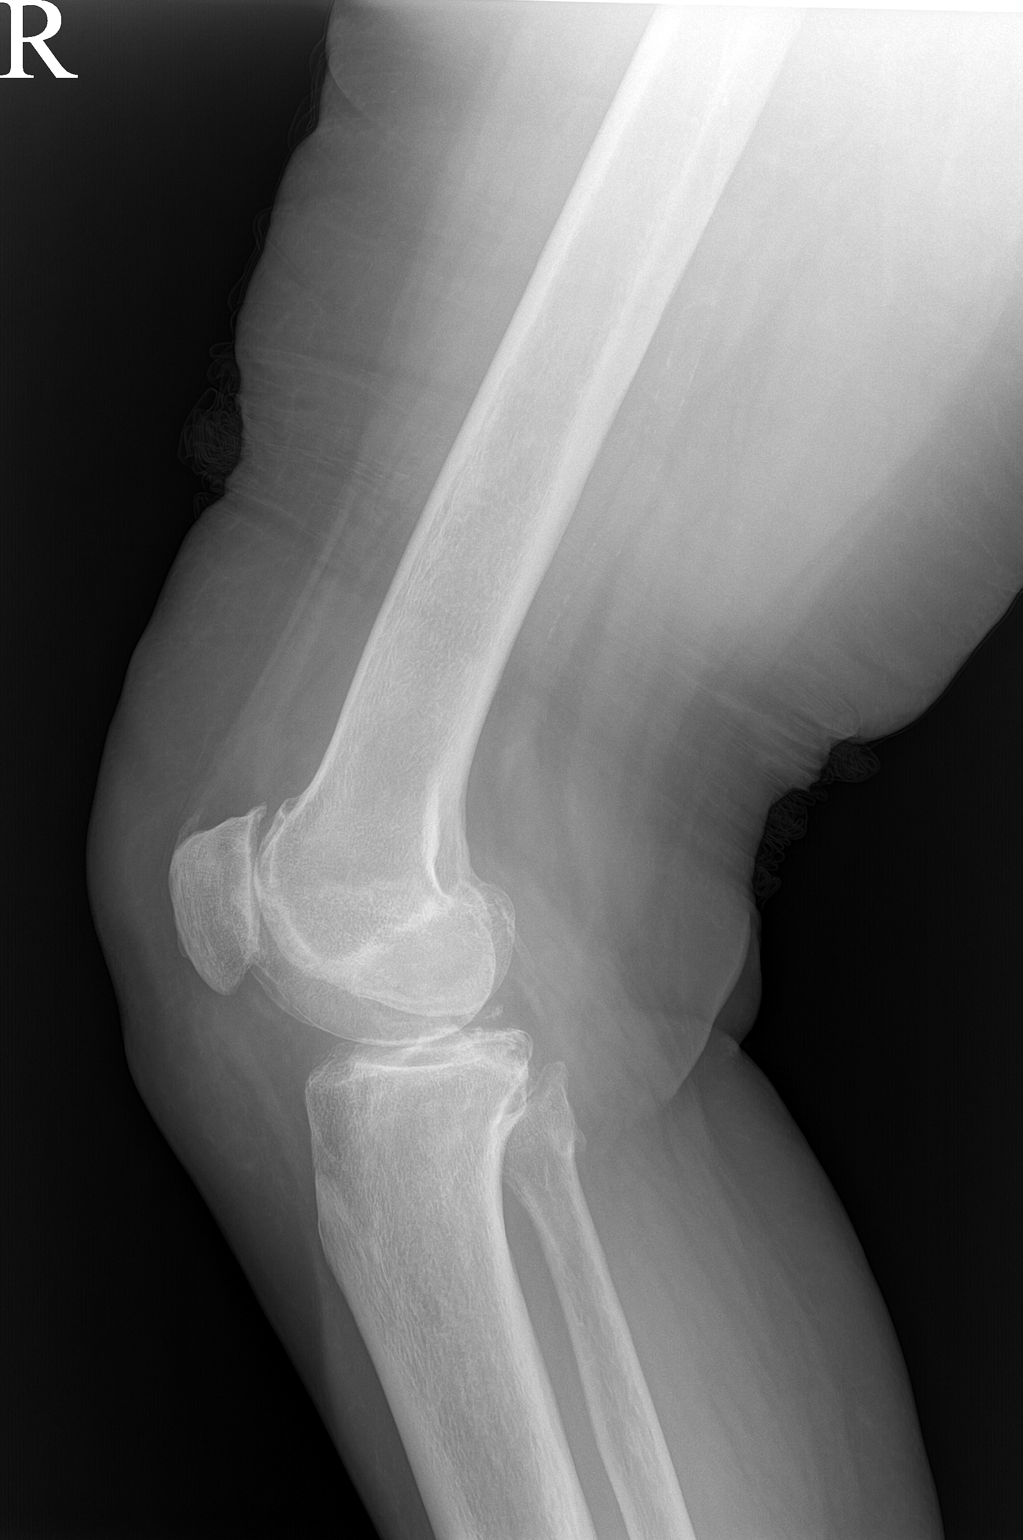

[patella]
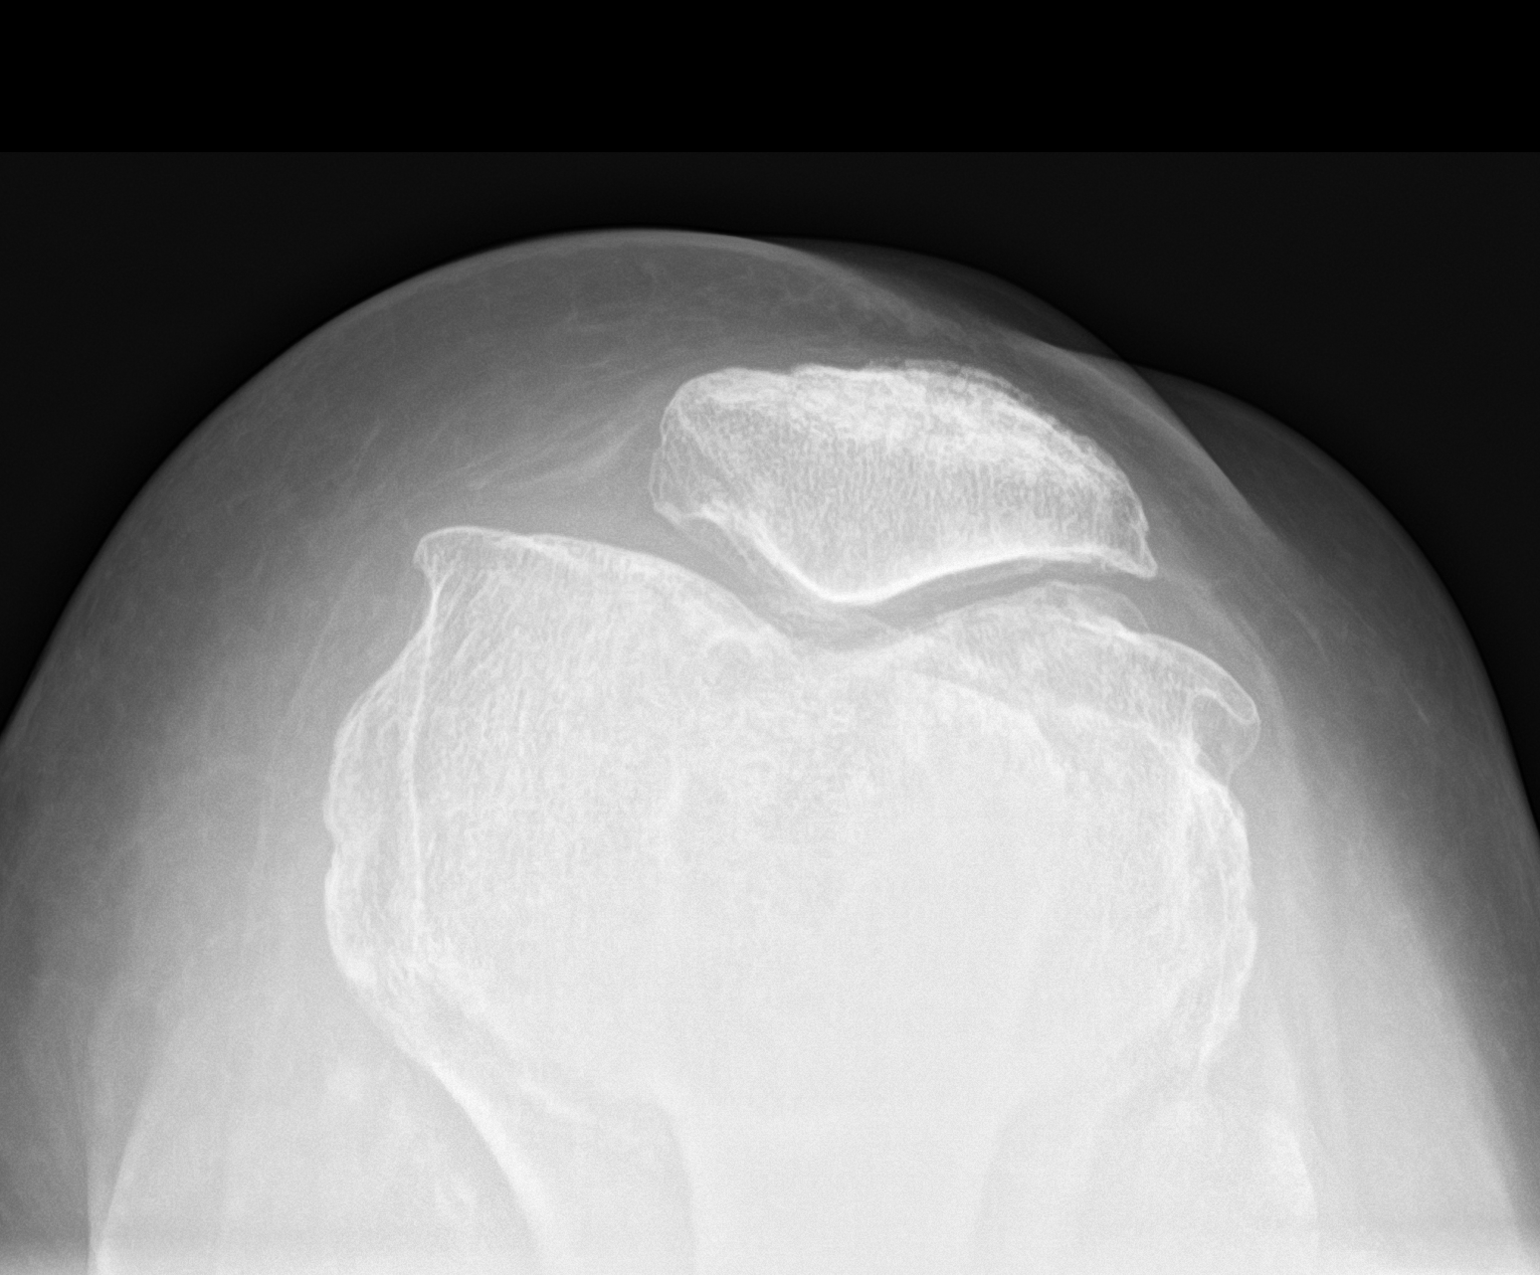

[3 of 3 positions shown; findings below may reference images not displayed]

FINDINGS: No evidence of fracture, dislocation, or joint effusion. Mild
narrowing and osteophyte formation is seen involving the medial and
lateral joint spaces with associated chondrocalcinosis. Moderate
narrowing of patellofemoral space is noted. Soft tissues are
unremarkable.
IMPRESSION: Mild-to-moderate degenerative joint disease is noted. No acute
abnormality is noted.
